# Patient Record
Sex: Female | Born: 1980 | Race: White | Hispanic: No | Marital: Married | Smoking: Former smoker
Health system: Southern US, Community
[De-identification: ages and names within clinical notes are randomized; demographics above are authoritative.]

## PROBLEM LIST (undated history)

## (undated) ENCOUNTER — Inpatient Hospital Stay (HOSPITAL_COMMUNITY): Payer: Self-pay

## (undated) DIAGNOSIS — R87629 Unspecified abnormal cytological findings in specimens from vagina: Secondary | ICD-10-CM

## (undated) DIAGNOSIS — E785 Hyperlipidemia, unspecified: Secondary | ICD-10-CM

## (undated) DIAGNOSIS — R519 Headache, unspecified: Secondary | ICD-10-CM

## (undated) DIAGNOSIS — B977 Papillomavirus as the cause of diseases classified elsewhere: Secondary | ICD-10-CM

## (undated) DIAGNOSIS — R51 Headache: Secondary | ICD-10-CM

## (undated) DIAGNOSIS — F32A Depression, unspecified: Secondary | ICD-10-CM

## (undated) DIAGNOSIS — F988 Other specified behavioral and emotional disorders with onset usually occurring in childhood and adolescence: Secondary | ICD-10-CM

## (undated) DIAGNOSIS — Z8619 Personal history of other infectious and parasitic diseases: Secondary | ICD-10-CM

## (undated) DIAGNOSIS — F329 Major depressive disorder, single episode, unspecified: Secondary | ICD-10-CM

## (undated) HISTORY — DX: Personal history of other infectious and parasitic diseases: Z86.19

## (undated) HISTORY — PX: COLPOSCOPY W/ BIOPSY / CURETTAGE: SUR283

## (undated) HISTORY — DX: Other specified behavioral and emotional disorders with onset usually occurring in childhood and adolescence: F98.8

## (undated) HISTORY — PX: WISDOM TOOTH EXTRACTION: SHX21

## (undated) HISTORY — DX: Hyperlipidemia, unspecified: E78.5

## (undated) HISTORY — DX: Unspecified abnormal cytological findings in specimens from vagina: R87.629

---
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
## 1999-06-09 ENCOUNTER — Other Ambulatory Visit: Admission: RE | Admit: 1999-06-09 | Discharge: 1999-06-09 | Payer: Self-pay | Admitting: Gynecology

## 2000-09-11 ENCOUNTER — Other Ambulatory Visit: Admission: RE | Admit: 2000-09-11 | Discharge: 2000-09-11 | Payer: Self-pay | Admitting: Gynecology

## 2003-08-13 ENCOUNTER — Other Ambulatory Visit: Admission: RE | Admit: 2003-08-13 | Discharge: 2003-08-13 | Payer: Self-pay | Admitting: Obstetrics and Gynecology

## 2005-12-28 ENCOUNTER — Ambulatory Visit: Payer: Self-pay | Admitting: Family Medicine

## 2007-02-12 ENCOUNTER — Ambulatory Visit: Payer: Self-pay | Admitting: Family Medicine

## 2007-05-14 ENCOUNTER — Ambulatory Visit: Payer: Self-pay | Admitting: Family Medicine

## 2015-01-12 LAB — OB RESULTS CONSOLE ANTIBODY SCREEN: Antibody Screen: NEGATIVE

## 2015-01-12 LAB — OB RESULTS CONSOLE RUBELLA ANTIBODY, IGM: RUBELLA: IMMUNE

## 2015-01-12 LAB — OB RESULTS CONSOLE HIV ANTIBODY (ROUTINE TESTING): HIV: NONREACTIVE

## 2015-01-12 LAB — OB RESULTS CONSOLE HEPATITIS B SURFACE ANTIGEN: Hepatitis B Surface Ag: NEGATIVE

## 2015-01-12 LAB — OB RESULTS CONSOLE ABO/RH: RH Type: POSITIVE

## 2015-01-12 LAB — OB RESULTS CONSOLE RPR: RPR: NONREACTIVE

## 2015-01-19 LAB — OB RESULTS CONSOLE GC/CHLAMYDIA
Chlamydia: NEGATIVE
Gonorrhea: NEGATIVE

## 2015-03-29 NOTE — L&D Delivery Note (Signed)
Delivery Note At 6:35 AM a viable and healthy female was delivered via Vaginal, Spontaneous Delivery (Presentation: ; Occiput Anterior).  APGAR:8 ,9 ; weight 6lb 12 oz .   Placenta status: spontaneous, intact not sent, .  Cord: CAN x 2 reducible with the following complications: Long.  Cord WJ:XBJYpH:none  Anesthesia: Local Epidural  Episiotomy: none  Lacerations: 2nd degree;Perineal Suture Repair: 3.0 chromic Est. Blood Loss (mL): 300  Mom to postpartum.  Baby to Couplet care / Skin to Skin.  Brytney Somes A 08/13/2015, 7:08 AM

## 2015-06-24 ENCOUNTER — Ambulatory Visit (INDEPENDENT_AMBULATORY_CARE_PROVIDER_SITE_OTHER): Payer: Self-pay | Admitting: Pediatrics

## 2015-06-24 DIAGNOSIS — Z7681 Expectant parent(s) prebirth pediatrician visit: Secondary | ICD-10-CM

## 2015-06-24 DIAGNOSIS — Z3490 Encounter for supervision of normal pregnancy, unspecified, unspecified trimester: Secondary | ICD-10-CM

## 2015-06-24 NOTE — Progress Notes (Signed)
Prenatal counseling for impending newborn done-- Z76.81  

## 2015-07-03 LAB — OB RESULTS CONSOLE GBS: STREP GROUP B AG: NEGATIVE

## 2015-08-12 ENCOUNTER — Inpatient Hospital Stay (HOSPITAL_COMMUNITY)
Admission: AD | Admit: 2015-08-12 | Discharge: 2015-08-15 | DRG: 774 | Disposition: A | Discharge status: Home / Self Care | Payer: Managed Care, Other (non HMO) | Source: Ambulatory Visit | Attending: Obstetrics and Gynecology | Admitting: Obstetrics and Gynecology

## 2015-08-12 ENCOUNTER — Inpatient Hospital Stay (HOSPITAL_COMMUNITY): Payer: Managed Care, Other (non HMO) | Admitting: Anesthesiology

## 2015-08-12 ENCOUNTER — Encounter (HOSPITAL_COMMUNITY): Payer: Self-pay | Admitting: *Deleted

## 2015-08-12 DIAGNOSIS — O4202 Full-term premature rupture of membranes, onset of labor within 24 hours of rupture: Principal | ICD-10-CM | POA: Diagnosis present

## 2015-08-12 DIAGNOSIS — Z87891 Personal history of nicotine dependence: Secondary | ICD-10-CM | POA: Diagnosis not present

## 2015-08-12 DIAGNOSIS — O324XX0 Maternal care for high head at term, not applicable or unspecified: Secondary | ICD-10-CM | POA: Diagnosis present

## 2015-08-12 DIAGNOSIS — Z3A38 38 weeks gestation of pregnancy: Secondary | ICD-10-CM

## 2015-08-12 DIAGNOSIS — A630 Anogenital (venereal) warts: Secondary | ICD-10-CM | POA: Diagnosis present

## 2015-08-12 DIAGNOSIS — O9832 Other infections with a predominantly sexual mode of transmission complicating childbirth: Secondary | ICD-10-CM | POA: Diagnosis present

## 2015-08-12 HISTORY — DX: Headache, unspecified: R51.9

## 2015-08-12 HISTORY — DX: Major depressive disorder, single episode, unspecified: F32.9

## 2015-08-12 HISTORY — DX: Headache: R51

## 2015-08-12 HISTORY — DX: Papillomavirus as the cause of diseases classified elsewhere: B97.7

## 2015-08-12 HISTORY — DX: Depression, unspecified: F32.A

## 2015-08-12 LAB — CBC
HCT: 35.6 % — ABNORMAL LOW (ref 36.0–46.0)
Hemoglobin: 11.7 g/dL — ABNORMAL LOW (ref 12.0–15.0)
MCH: 28.4 pg (ref 26.0–34.0)
MCHC: 32.9 g/dL (ref 30.0–36.0)
MCV: 86.4 fL (ref 78.0–100.0)
PLATELETS: 289 10*3/uL (ref 150–400)
RBC: 4.12 MIL/uL (ref 3.87–5.11)
RDW: 14.7 % (ref 11.5–15.5)
WBC: 8.8 10*3/uL (ref 4.0–10.5)

## 2015-08-12 LAB — TYPE AND SCREEN
ABO/RH(D): A POS
Antibody Screen: NEGATIVE

## 2015-08-12 LAB — ABO/RH: ABO/RH(D): A POS

## 2015-08-12 LAB — POCT FERN TEST: POCT Fern Test: POSITIVE

## 2015-08-12 MED ORDER — PHENYLEPHRINE 40 MCG/ML (10ML) SYRINGE FOR IV PUSH (FOR BLOOD PRESSURE SUPPORT)
80.0000 ug | PREFILLED_SYRINGE | INTRAVENOUS | Status: DC | PRN
  Filled 2015-08-12: qty 10

## 2015-08-12 MED ORDER — LIDOCAINE HCL (PF) 1 % IJ SOLN
INTRAMUSCULAR | Status: DC | PRN
  Administered 2015-08-12 (×2): 5 mL

## 2015-08-12 MED ORDER — OXYTOCIN 40 UNITS IN LACTATED RINGERS INFUSION - SIMPLE MED
1.0000 m[IU]/min | INTRAVENOUS | Status: DC
  Administered 2015-08-12: 2 m[IU]/min via INTRAVENOUS

## 2015-08-12 MED ORDER — TERBUTALINE SULFATE 1 MG/ML IJ SOLN
0.2500 mg | Freq: Once | INTRAMUSCULAR | Status: DC | PRN
Start: 2015-08-12 — End: 2015-08-13

## 2015-08-12 MED ORDER — OXYTOCIN 40 UNITS IN LACTATED RINGERS INFUSION - SIMPLE MED
2.5000 [IU]/h | INTRAVENOUS | Status: DC
  Filled 2015-08-12: qty 1000

## 2015-08-12 MED ORDER — ONDANSETRON HCL 4 MG/2ML IJ SOLN: 4.0000 mg | Freq: Four times a day (QID) | INTRAMUSCULAR | Status: DC | PRN

## 2015-08-12 MED ORDER — LIDOCAINE HCL (PF) 1 % IJ SOLN
30.0000 mL | INTRAMUSCULAR | Status: DC | PRN
  Administered 2015-08-13: 30 mL via SUBCUTANEOUS
  Filled 2015-08-12: qty 30

## 2015-08-12 MED ORDER — LACTATED RINGERS IV SOLN
500.0000 mL | Freq: Once | INTRAVENOUS | Status: AC
  Administered 2015-08-12: 500 mL via INTRAVENOUS

## 2015-08-12 MED ORDER — EPHEDRINE 5 MG/ML INJ: 10.0000 mg | INTRAVENOUS | Status: DC | PRN

## 2015-08-12 MED ORDER — FLEET ENEMA 7-19 GM/118ML RE ENEM: 1.0000 | ENEMA | RECTAL | Status: DC | PRN

## 2015-08-12 MED ORDER — OXYTOCIN BOLUS FROM INFUSION
500.0000 mL | INTRAVENOUS | Status: DC
Start: 2015-08-12 — End: 2015-08-13

## 2015-08-12 MED ORDER — PHENYLEPHRINE 40 MCG/ML (10ML) SYRINGE FOR IV PUSH (FOR BLOOD PRESSURE SUPPORT)
80.0000 ug | PREFILLED_SYRINGE | INTRAVENOUS | Status: DC | PRN
Start: 2015-08-12 — End: 2015-08-13

## 2015-08-12 MED ORDER — FENTANYL 2.5 MCG/ML BUPIVACAINE 1/10 % EPIDURAL INFUSION (WH - ANES)
14.0000 mL/h | INTRAMUSCULAR | Status: DC | PRN
  Administered 2015-08-12: 14 mL/h via EPIDURAL
  Filled 2015-08-12: qty 125

## 2015-08-12 MED ORDER — BUTORPHANOL TARTRATE 1 MG/ML IJ SOLN
2.0000 mg | INTRAMUSCULAR | Status: DC | PRN
  Administered 2015-08-12: 2 mg via INTRAVENOUS
  Filled 2015-08-12: qty 2

## 2015-08-12 MED ORDER — OXYCODONE-ACETAMINOPHEN 5-325 MG PO TABS: 2.0000 | ORAL_TABLET | ORAL | Status: DC | PRN

## 2015-08-12 MED ORDER — DIPHENHYDRAMINE HCL 50 MG/ML IJ SOLN: 12.5000 mg | INTRAMUSCULAR | Status: DC | PRN

## 2015-08-12 MED ORDER — CITRIC ACID-SODIUM CITRATE 334-500 MG/5ML PO SOLN: 30.0000 mL | ORAL | Status: DC | PRN

## 2015-08-12 MED ORDER — OXYCODONE-ACETAMINOPHEN 5-325 MG PO TABS: 1.0000 | ORAL_TABLET | ORAL | Status: DC | PRN

## 2015-08-12 MED ORDER — LACTATED RINGERS IV SOLN
INTRAVENOUS | Status: DC
  Administered 2015-08-12 – 2015-08-13 (×2): via INTRAVENOUS

## 2015-08-12 MED ORDER — ACETAMINOPHEN 325 MG PO TABS: 650.0000 mg | ORAL_TABLET | ORAL | Status: DC | PRN

## 2015-08-12 MED ORDER — LACTATED RINGERS IV SOLN: 500.0000 mL | INTRAVENOUS | Status: DC | PRN

## 2015-08-12 NOTE — Anesthesia Procedure Notes (Signed)
Epidural Patient location during procedure: OB  Staffing Anesthesiologist: Kellen Dutch Performed by: anesthesiologist   Preanesthetic Checklist Completed: patient identified, site marked, surgical consent, pre-op evaluation, timeout performed, IV checked, risks and benefits discussed and monitors and equipment checked  Epidural Patient position: sitting Prep: DuraPrep Patient monitoring: heart rate, continuous pulse ox and blood pressure Approach: right paramedian Location: L4-L5 Injection technique: LOR saline  Needle:  Needle type: Tuohy  Needle gauge: 17 G Needle length: 9 cm and 9 Needle insertion depth: 6 cm Catheter type: closed end flexible Catheter size: 20 Guage Catheter at skin depth: 10 cm Test dose: negative  Assessment Events: blood not aspirated, injection not painful, no injection resistance, negative IV test and no paresthesia  Additional Notes Patient identified. Risks/Benefits/Options discussed with patient including but not limited to bleeding, infection, nerve damage, paralysis, failed block, incomplete pain control, headache, blood pressure changes, nausea, vomiting, reactions to medication both or allergic, itching and postpartum back pain. Confirmed with bedside nurse the patient's most recent platelet count. Confirmed with patient that they are not currently taking any anticoagulation, have any bleeding history or any family history of bleeding disorders. Patient expressed understanding and wished to proceed. All questions were answered. Sterile technique was used throughout the entire procedure. Please see nursing notes for vital signs. Test dose was given through epidural needle and negative prior to continuing to dose epidural or start infusion. Warning signs of high block given to the patient including shortness of breath, tingling/numbness in hands, complete motor block, or any concerning symptoms with instructions to call for help. Patient was given  instructions on fall risk and not to get out of bed. All questions and concerns addressed with instructions to call with any issues.   

## 2015-08-12 NOTE — H&P (Signed)
Ann Morris is a 35 y.o. female presenting with SROM clear fluid @2 :30 pm. GBS cx neg  Maternal Medical History:  Reason for admission: Rupture of membranes.   Contractions: Frequency: rare.    Fetal activity: Perceived fetal activity is normal.    Prenatal complications: no prenatal complications   OB History    Gravida Para Term Preterm AB TAB SAB Ectopic Multiple Living   1              Past Medical History  Diagnosis Date  . Headache   . Depression   . HPV in female    Past Surgical History  Procedure Laterality Date  . Colposcopy w/ biopsy / curettage     Family History: family history is not on file. Social History:  reports that she quit smoking about 7 months ago. She does not have any smokeless tobacco history on file. She reports that she drinks alcohol. She reports that she does not use illicit drugs.   Prenatal Transfer Tool  Maternal Diabetes: No Genetic Screening: Normal Maternal Ultrasounds/Referrals: Normal Fetal Ultrasounds or other Referrals:  None Maternal Substance Abuse:  No Significant Maternal Medications:  Meds include: Zoloft Significant Maternal Lab Results:  Lab values include: Group B Strep negative Other Comments:  None  Review of Systems  All other systems reviewed and are negative.   Dilation: 3.5 Effacement (%): 80 Station: -2 Exam by:: kim fields, rn Blood pressure 117/68, pulse 69, temperature 97.6 F (36.4 C), temperature source Oral, resp. rate 20, height 5\' 1"  (1.549 m), weight 92.08 kg (203 lb). Exam Physical Exam  Constitutional: She is oriented to person, place, and time. She appears well-developed and well-nourished.  HENT:  Head: Atraumatic.  Eyes: EOM are normal.  Neck: Neck supple.  Cardiovascular: Regular rhythm.   Respiratory: Breath sounds normal.  GI: Soft.  Musculoskeletal: She exhibits edema.  Neurological: She is alert and oriented to person, place, and time.  Skin: Skin is warm and dry.   Psychiatric: She has a normal mood and affect.    Prenatal labs: ABO, Rh: --/--/A POS (05/17 1650) Antibody: NEG (05/17 1650) Rubella: Immune (10/17 0000) RPR: Nonreactive (10/17 0000)  HBsAg: Negative (10/17 0000)  HIV: Non-reactive (10/17 0000)  GBS: Negative (04/07 0000)   Assessment/Plan: SROM Term gestation P) admit routine labs. Pitocin augmentation.Epidural   Ann Morris A 08/12/2015, 6:31 PM

## 2015-08-12 NOTE — Anesthesia Preprocedure Evaluation (Signed)

## 2015-08-12 NOTE — MAU Note (Signed)
Urine sent to lab @ 1348

## 2015-08-13 ENCOUNTER — Encounter (HOSPITAL_COMMUNITY): Payer: Self-pay | Admitting: Obstetrics and Gynecology

## 2015-08-13 LAB — RPR: RPR: NONREACTIVE

## 2015-08-13 MED ORDER — WITCH HAZEL-GLYCERIN EX PADS: 1.0000 "application " | MEDICATED_PAD | CUTANEOUS | Status: DC | PRN

## 2015-08-13 MED ORDER — BENZOCAINE-MENTHOL 20-0.5 % EX AERO
1.0000 "application " | INHALATION_SPRAY | CUTANEOUS | Status: DC | PRN
  Filled 2015-08-13 (×3): qty 56

## 2015-08-13 MED ORDER — SENNOSIDES-DOCUSATE SODIUM 8.6-50 MG PO TABS
2.0000 | ORAL_TABLET | ORAL | Status: DC
  Administered 2015-08-13 – 2015-08-14 (×2): 2 via ORAL
  Filled 2015-08-13 (×2): qty 2

## 2015-08-13 MED ORDER — ACETAMINOPHEN 325 MG PO TABS
650.0000 mg | ORAL_TABLET | ORAL | Status: DC | PRN
  Administered 2015-08-14 – 2015-08-15 (×3): 650 mg via ORAL
  Filled 2015-08-13 (×3): qty 2

## 2015-08-13 MED ORDER — ONDANSETRON HCL 4 MG/2ML IJ SOLN: 4.0000 mg | INTRAMUSCULAR | Status: DC | PRN

## 2015-08-13 MED ORDER — ZOLPIDEM TARTRATE 5 MG PO TABS: 5.0000 mg | ORAL_TABLET | Freq: Every evening | ORAL | Status: DC | PRN

## 2015-08-13 MED ORDER — IBUPROFEN 600 MG PO TABS
600.0000 mg | ORAL_TABLET | Freq: Four times a day (QID) | ORAL | Status: DC
  Administered 2015-08-13 – 2015-08-14 (×4): 600 mg via ORAL
  Filled 2015-08-13 (×4): qty 1

## 2015-08-13 MED ORDER — DIBUCAINE 1 % RE OINT
1.0000 "application " | TOPICAL_OINTMENT | RECTAL | Status: DC | PRN
  Filled 2015-08-13: qty 28.4

## 2015-08-13 MED ORDER — OXYCODONE HCL 5 MG PO TABS
10.0000 mg | ORAL_TABLET | ORAL | Status: DC | PRN
Start: 2015-08-13 — End: 2015-08-14

## 2015-08-13 MED ORDER — SIMETHICONE 80 MG PO CHEW: 80.0000 mg | CHEWABLE_TABLET | ORAL | Status: DC | PRN

## 2015-08-13 MED ORDER — PRENATAL MULTIVITAMIN CH
1.0000 | ORAL_TABLET | Freq: Every day | ORAL | Status: DC
  Administered 2015-08-13 – 2015-08-14 (×2): 1 via ORAL
  Filled 2015-08-13 (×2): qty 1

## 2015-08-13 MED ORDER — COCONUT OIL OIL
1.0000 "application " | TOPICAL_OIL | Status: DC | PRN
  Filled 2015-08-13: qty 120

## 2015-08-13 MED ORDER — FERROUS SULFATE 325 (65 FE) MG PO TABS
325.0000 mg | ORAL_TABLET | Freq: Two times a day (BID) | ORAL | Status: DC
  Administered 2015-08-13 – 2015-08-14 (×3): 325 mg via ORAL
  Filled 2015-08-13 (×4): qty 1

## 2015-08-13 MED ORDER — ONDANSETRON HCL 4 MG PO TABS: 4.0000 mg | ORAL_TABLET | ORAL | Status: DC | PRN

## 2015-08-13 MED ORDER — DIPHENHYDRAMINE HCL 25 MG PO CAPS: 25.0000 mg | ORAL_CAPSULE | Freq: Four times a day (QID) | ORAL | Status: DC | PRN

## 2015-08-13 MED ORDER — PNEUMOCOCCAL VAC POLYVALENT 25 MCG/0.5ML IJ INJ
0.5000 mL | INJECTION | INTRAMUSCULAR | Status: DC
  Filled 2015-08-13: qty 0.5

## 2015-08-13 MED ORDER — OXYCODONE HCL 5 MG PO TABS: 5.0000 mg | ORAL_TABLET | ORAL | Status: DC | PRN

## 2015-08-13 NOTE — Lactation Note (Signed)
This note was copied from a baby's chart. Lactation Consultation Note  Patient Name: [name] JXBJY'NToday's Date: 08/13/2015 Reason for consult: Initial assessment  Visited with Mom, baby 5 hrs old.  Baby has breast fed 2 times.  Baby had baby in cradle hold skin to skin.  Baby opening his mouth, so offered an assist.  Assisted Mom to use a cross cradle hold.  Baby opens widely and takes a few sucks before falling asleep.  Showed Mom how to firmly support baby's head, and shape her breast using breast compression.  Baby opens very widely.  Recommended keeping baby on her chest skin to skin.  To feed often on cue.  Recommended using manual breast expression to express some colostrum prior to latching.  Mom explained importance of asking for help when feeding baby.  Brochure left in room.  Explained about IP and OP lactation support available to her.  To call for help prn.  Consult Status Consult Status: Follow-up Date: 08/14/15 Follow-up type: In-patient    [name], [name] E 08/13/2015, 12:17 PM

## 2015-08-13 NOTE — Progress Notes (Signed)
MOB was referred for history of depression/anxiety. * Referral screened out by Clinical Social Worker because none of the following criteria appear to apply: ~ History of anxiety/depression during this pregnancy, or of post-partum depression. ~ Diagnosis of anxiety and/or depression within last 3 years OR * MOB's symptoms currently being treated with medication and/or therapy. Please contact the Clinical Social Worker if needs arise, or if MOB requests.  MOB has rx for Zoloft. 

## 2015-08-13 NOTE — Anesthesia Postprocedure Evaluation (Signed)
Anesthesia Post Note  Patient: Ann Morris  Procedure(s) Performed: * No procedures listed *  Patient location during evaluation: Mother Baby Anesthesia Type: Epidural Level of consciousness: awake and alert and oriented Pain management: satisfactory to patient Vital Signs Assessment: post-procedure vital signs reviewed and stable Respiratory status: spontaneous breathing and nonlabored ventilation Cardiovascular status: stable Postop Assessment: no headache, no backache, no signs of nausea or vomiting, adequate PO intake and patient able to bend at knees (patient up walking) Anesthetic complications: no     Last Vitals:  Filed Vitals:   08/13/15 0845 08/13/15 0950  BP: 111/60 102/61  Pulse: 79 85  Temp: 36.8 C 36.8 C  Resp: 16 18    Last Pain:  Filed Vitals:   08/13/15 1204  PainSc: 2    Pain Goal: Patients Stated Pain Goal: 2 (08/13/15 1203)               Madison HickmanGREGORY,Marly Schuld

## 2015-08-13 NOTE — Progress Notes (Signed)
S; feels no ctx  O: Epidural VE fully/+1 station Asynclitic LOA  Tracing: baseline 135 (+) accel Ctx q 2-4 mins  IMP: arrest of descent P) right exaggerated sims. Guarded for vaginal delivery. Turn down epidural

## 2015-08-14 LAB — CBC
HEMATOCRIT: 33.2 % — AB (ref 36.0–46.0)
Hemoglobin: 10.7 g/dL — ABNORMAL LOW (ref 12.0–15.0)
MCH: 28.2 pg (ref 26.0–34.0)
MCHC: 32.2 g/dL (ref 30.0–36.0)
MCV: 87.4 fL (ref 78.0–100.0)
Platelets: 262 10*3/uL (ref 150–400)
RBC: 3.8 MIL/uL — ABNORMAL LOW (ref 3.87–5.11)
RDW: 15 % (ref 11.5–15.5)
WBC: 10.5 10*3/uL (ref 4.0–10.5)

## 2015-08-14 MED ORDER — IBUPROFEN 800 MG PO TABS
800.0000 mg | ORAL_TABLET | Freq: Three times a day (TID) | ORAL | Status: DC
Start: 2015-08-14 — End: 2015-08-15
  Administered 2015-08-14 – 2015-08-15 (×3): 800 mg via ORAL
  Filled 2015-08-14 (×3): qty 1

## 2015-08-14 MED ORDER — FERROUS SULFATE 325 (65 FE) MG PO TABS
325.0000 mg | ORAL_TABLET | Freq: Every day | ORAL | Status: DC
  Administered 2015-08-15: 325 mg via ORAL
  Filled 2015-08-14: qty 1

## 2015-08-14 MED ORDER — MAGNESIUM OXIDE 400 (241.3 MG) MG PO TABS
400.0000 mg | ORAL_TABLET | Freq: Every day | ORAL | Status: DC
  Administered 2015-08-14 – 2015-08-15 (×2): 400 mg via ORAL
  Filled 2015-08-14 (×2): qty 1

## 2015-08-14 NOTE — Lactation Note (Signed)
This note was copied from a baby's chart. Lactation Consultation Note  Patient Name: Ann Morris MaizeCourtney Brightbill RUEAV'WToday's Date: 08/14/2015 Reason for consult: Follow-up assessment (baby in the tx nursery for circ - see LC note )  [place] baby is having a circ presently. Per mom my nipples are tender especially the left .  LC assessed with moms permission and noted some bruising on the areolas and the nipples to  Be pinky red, areolas semi - compressible, (right areola more compressible than left ) , hand expressed,  small drops noted and LC had pump applied to nipples.  LC instructed mom on the basics - breast massage , hand express, pre- pump to make the areola more compressible  And reverse pressure. Shells between feedings ( LC instructed mom on the use of shells and reviewed hand pump)  After mom put on her breast feeding bra - LC assisted with shells.  Per mom will have a Spectra at home ( DEBP ).  LC encouraged mom to call when baby is showing feeding cues for feeding assessment.  LC explained to mom post circ baby's tend to be sleepy , skin to skin is important, hand expressing and call for assistance.  Steps above.    Maternal Data Has patient been taught Hand Expression?: Yes  Feeding    LATCH Score/Interventions                Intervention(s): Breastfeeding basics reviewed     Lactation Tools Discussed/Used Tools: Shells;Pump Shell Type: Inverted Breast pump type: Manual Pump Review: Setup, frequency, and cleaning Initiated by:: Reviewed set up of the hand pump  Date initiated:: 08/14/15   Consult Status Consult Status: Follow-up Date: 08/14/15 Follow-up type: In-patient    Kathrin Greathouseorio, Catrell Morrone Ann 08/14/2015, 4:14 PM

## 2015-08-14 NOTE — Lactation Note (Signed)
This note was copied from a baby's chart. Lactation Consultation Note  Patient Name: Ann Eustaquio MaizeCourtney Gray XBMWU'XToday's Date: 08/14/2015 Reason for consult: Follow-up assessment;Breast/nipple pain Mom had baby latched to left breast in cross cradle hold. Mom complains of continued nipple pain with nursing. Baby appears to have deep latch and demonstrating good suckling bursts. Nipple had slight crease when baby came off the breast. Baby starting to cluster feed. Assisted Mom with latching baby in asymmetrical latch to right breast. Mom reports mild discomfort. Care for sore nipples reviewed, use EBM, coconut oil. Gave comfort gels, Mom asking about putting ice on breast. Advised to alternate comfort gels with coconut oil. Advised to alternate positions each feeding to help nipples heal. Advised to call for assist with latch as needed. If nipple pain does not improve with improved latch, consider nipple shield. No nipple breakdown noted at this visit.   Maternal Data    Feeding Feeding Type: Breast Fed Length of feed: 20 min  LATCH Score/Interventions Latch: Grasps breast easily, tongue down, lips flanged, rhythmical sucking. Intervention(s): Skin to skin Intervention(s): Adjust position;Assist with latch;Breast massage;Breast compression  Audible Swallowing: A few with stimulation Intervention(s): Skin to skin  Type of Nipple: Everted at rest and after stimulation  Comfort (Breast/Nipple): Filling, red/small blisters or bruises, mild/mod discomfort  Problem noted: Mild/Moderate discomfort Interventions (Mild/moderate discomfort): Hand massage;Hand expression;Comfort gels (EBM to sore nipples w/comfort gels. Alternate w/coconut oil)  Hold (Positioning): Assistance needed to correctly position infant at breast and maintain latch.  LATCH Score: 7  Lactation Tools Discussed/Used     Consult Status Consult Status: Follow-up Date: 08/15/15 Follow-up type: In-patient    Alfred LevinsGranger, Illya Gienger  Ann 08/14/2015, 7:53 PM

## 2015-08-14 NOTE — Progress Notes (Signed)
PPD 2 SVD  S:  Reports feeling well - ready to go home             Tolerating po/ No nausea or vomiting             Bleeding is light             Pain controlled with motrin             Up ad lib / ambulatory / voiding QS  Newborn breast feeding   O:               VS: BP 114/66 mmHg  Pulse 82  Temp(Src) 98.2 F (36.8 C) (Oral)  Resp 18  Ht 5\' 1"  (1.549 m)  Wt 92.08 kg (203 lb)  BMI 38.38 kg/m2  SpO2 82%  Breastfeeding? Unknown   LABS:              Recent Labs  08/12/15 1650 08/14/15 0556  WBC 8.8 10.5  HGB 11.7* 10.7*  PLT 289 262               Blood type: --/--/A POS, A POS (05/17 1650)  Rubella: Immune (10/17 0000)                       Physical Exam:             Alert and oriented X3  Abdomen: soft, non-tender, non-distended              Fundus: firm, non-tender, U-1  Perineum: no edema  Lochia: light  Extremities: no edema, no calf pain or tenderness    A: PPD # 2   Doing well - stable status  P: Routine post partum orders  DC home - WOB booklet - instructions reviewed  Marlinda MikeBAILEY, TANYA CNM, MSN, [facility] 08/14/2015, 8:23 AM

## 2015-08-15 MED ORDER — IBUPROFEN 800 MG PO TABS
800.0000 mg | ORAL_TABLET | Freq: Three times a day (TID) | ORAL | Status: DC
Start: 2015-08-15 — End: 2016-02-01

## 2015-08-15 NOTE — Discharge Summary (Signed)
Obstetric Discharge Summary Reason for Admission: onset of labor Prenatal Procedures: none Intrapartum Procedures: spontaneous vaginal delivery and epidural Postpartum Procedures: none Complications-Operative and Postpartum: 2nd degree perineal laceration HEMOGLOBIN  Date Value Ref Range Status  08/14/2015 10.7* 12.0 - 15.0 g/dL Final   HCT  Date Value Ref Range Status  08/14/2015 33.2* 36.0 - 46.0 % Final    Physical Exam:  General: alert, cooperative and no distress Lochia: appropriate Uterine Fundus: firm Incision: healing well DVT Evaluation: No evidence of DVT seen on physical exam.  Discharge Diagnoses: Term Pregnancy-delivered  Discharge Information: Date: 08/15/2015 Activity: pelvic rest Diet: routine Medications: PNV and Ibuprofen Condition: stable Instructions: refer to practice specific booklet Discharge to: home Follow-up Information    Follow up with COUSINS,SHERONETTE A, MD. Schedule an appointment as soon as possible for a visit in 6 weeks.   Specialty:  Obstetrics and Gynecology   Contact information:   [address] [place] [id number] [phone]       Newborn Data: Live born female  Birth Weight: 6 lb 12.6 oz (3080 g) APGAR: 8, 9  Home with mother.  Marlinda MikeBAILEY, TANYA 08/15/2015, 11:01 AM

## 2015-08-15 NOTE — Lactation Note (Signed)
This note was copied from a baby's chart. Lactation Consultation Note  Patient Name: Boy Eustaquio MaizeCourtney Ostlund AVWUJ'WToday's Date: 08/15/2015 Reason for consult: Follow-up assessment Baby 52 hours old and has been to the breast consistently and per mom latching has improved since using the breast shells.  [facility] - in cross cradle - multiply swallows noted and increased with breast compressions.  Mom comfortable with latch. When baby released after 25 mins , nipple slightly slanted , areola more compressible than yesterday.  Nipples appear less sore and pink. Mom was given comfort gels last night. Has a hand pump. LC increased flanged to #27 for when The milk comes in . Sore nipple and engorgement prevention and tx reviewed. Referred to the Baby and me booklet pages 24 -25.  Per mom has Spectra DEBP at home.  Mother informed of post-discharge support and given phone number to the lactation department, including services for phone call assistance; out-patient appointments; and breastfeeding support group. List of other breastfeeding resources in the community given in the handout. Encouraged mother to call for problems or concerns related to breastfeeding.  Maternal Data    Feeding Feeding Type:  (baby already latched w/ multiply swallows, increased w/ breast compressions ) Length of feed: 25 min (muliply swallows noted )  LATCH Score/Interventions Latch:  (latched with depth ) Intervention(s): Skin to skin  Audible Swallowing:  (multiply swallows, increased with breast compressions )  Type of Nipple:  (when baby released nipple more erect than yesterday , and areola more compressed )  Comfort (Breast/Nipple):  (per mom latch more comfortable )     Hold (Positioning):  (mom independent with latch ) Intervention(s): Breastfeeding basics reviewed     Lactation Tools Discussed/Used Tools: Shells;Flanges Flange Size: 27 (X 2 ) Shell Type: Inverted Breast  pump type: Manual WIC Program: No   Consult Status Consult Status: Complete Date: 08/15/15    Kathrin Greathouseorio, Jw Covin Ann 08/15/2015, 10:44 AM

## 2015-08-15 NOTE — Progress Notes (Signed)
PPD 1 SVD  S:  Reports feeling well - tried             Tolerating po/ No nausea or vomiting             Bleeding is moderate             Pain controlled with motrin             Up ad lib / ambulatory / voiding QS  Newborn breast feeding   O:  VS: temp 98.2 - pulse 82 - resp 18 - BP 114/66  LABS:              Recent Labs  08/12/15 1650 08/14/15 0556  WBC 8.8 10.5  HGB 11.7* 10.7*  PLT 289 262               Blood type: --/--/A POS, A POS (05/17 1650)  Rubella: Immune (10/17 0000)                         Physical Exam:             Alert and oriented X3  Abdomen: soft, non-tender, non-distended              Fundus: firm, non-tender, Ueven  Perineum: ice pack in place  Lochia: light  Extremities: trace edema, no calf pain or tenderness    A: PPD # 1   Doing well - stable status  P: Routine post partum orders    Marlinda MikeBAILEY, Catarino Vold CNM, MSN, [facility]NM 08/14/2015, 10:58 AM

## 2016-01-29 ENCOUNTER — Encounter: Payer: Self-pay | Admitting: Medical

## 2016-02-01 ENCOUNTER — Ambulatory Visit (INDEPENDENT_AMBULATORY_CARE_PROVIDER_SITE_OTHER): Payer: 59 | Admitting: Family Medicine

## 2016-02-01 ENCOUNTER — Encounter: Payer: Self-pay | Admitting: Family Medicine

## 2016-02-01 VITALS — BP 120/64 | HR 68 | Ht 60.75 in | Wt 188.4 lb

## 2016-02-01 DIAGNOSIS — Z23 Encounter for immunization: Secondary | ICD-10-CM | POA: Diagnosis not present

## 2016-02-01 DIAGNOSIS — Z8349 Family history of other endocrine, nutritional and metabolic diseases: Secondary | ICD-10-CM

## 2016-02-01 DIAGNOSIS — Z0000 Encounter for general adult medical examination without abnormal findings: Secondary | ICD-10-CM | POA: Diagnosis not present

## 2016-02-01 LAB — POCT URINALYSIS DIPSTICK
Bilirubin, UA: NEGATIVE
Glucose, UA: NEGATIVE
Ketones, UA: NEGATIVE
Leukocytes, UA: NEGATIVE
NITRITE UA: NEGATIVE
PH UA: 6
Protein, UA: NEGATIVE
RBC UA: NEGATIVE
Spec Grav, UA: >=1.03
UROBILINOGEN UA: NEGATIVE

## 2016-02-01 LAB — COMPREHENSIVE METABOLIC PANEL
ALK PHOS: 95 U/L (ref 33–115)
ALT: 15 U/L (ref 6–29)
AST: 21 U/L (ref 10–30)
Albumin: 4.5 g/dL (ref 3.6–5.1)
BUN: 11 mg/dL (ref 7–25)
CO2: 25 mmol/L (ref 20–31)
Calcium: 9.2 mg/dL (ref 8.6–10.2)
Chloride: 103 mmol/L (ref 98–110)
Creat: 0.6 mg/dL (ref 0.50–1.10)
GLUCOSE: 87 mg/dL (ref 65–99)
POTASSIUM: 4.4 mmol/L (ref 3.5–5.3)
Sodium: 137 mmol/L (ref 135–146)
Total Bilirubin: 0.4 mg/dL (ref 0.2–1.2)
Total Protein: 7 g/dL (ref 6.1–8.1)

## 2016-02-01 LAB — CBC WITH DIFFERENTIAL/PLATELET
BASOS PCT: 0 %
Basophils Absolute: 0 cells/uL (ref 0–200)
Eosinophils Absolute: 136 cells/uL (ref 15–500)
Eosinophils Relative: 2 %
HCT: 41.6 % (ref 35.0–45.0)
Hemoglobin: 14.2 g/dL (ref 11.7–15.5)
LYMPHS PCT: 35 %
Lymphs Abs: 2380 cells/uL (ref 850–3900)
MCH: 29.9 pg (ref 27.0–33.0)
MCHC: 34.1 g/dL (ref 32.0–36.0)
MCV: 87.6 fL (ref 80.0–100.0)
MONOS PCT: 5 %
MPV: 10.4 fL (ref 7.5–12.5)
Monocytes Absolute: 340 cells/uL (ref 200–950)
Neutro Abs: 3944 cells/uL (ref 1500–7800)
Neutrophils Relative %: 58 %
PLATELETS: 312 10*3/uL (ref 140–400)
RBC: 4.75 MIL/uL (ref 3.80–5.10)
RDW: 12.8 % (ref 11.0–15.0)
WBC: 6.8 10*3/uL (ref 4.0–10.5)

## 2016-02-01 LAB — TSH: TSH: 0.54 mIU/L

## 2016-02-01 LAB — LIPID PANEL
CHOL/HDL RATIO: 4.5 ratio (ref <5.0)
Cholesterol: 191 mg/dL (ref <200)
HDL: 42 mg/dL — AB (ref >50)
LDL CALC: 123 mg/dL — AB
Triglycerides: 132 mg/dL (ref <150)
VLDL: 26 mg/dL (ref <30)

## 2016-02-01 NOTE — Progress Notes (Signed)
Subjective:    Patient ID: Ann Morris, female    DOB: 08/28/1980, 35 y.o.   MRN: [id number]  HPI Chief Complaint  Patient presents with  . new pt    new pt, cpe, wants flu shot today. no other concerns   She is new to the practice and here for a complete physical exam. She states she had a baby 5 1/2 months.  She is not breastfeeding.  Previous medical care: mainly at OB/GYN and urgent care as needed.  Last CPE: 3 years ago.   Other providers: Dr. Cherly Hensenousins- OB/GYN . Dr. Evelene CroonKaur- psychologist   Past medical history: depression- taking Zoloft since HS.  ADHD- takes adderall and this is prescribed by Dr. Evelene CroonKaur.  Surgeries: oral surgery.   Family history: HTN- mother. Hyperlipidemia- brother. DM in father and PGF. Heart attacks with PGF.    Social history: Lives with husband.  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 [employer] grad.  Former smoker and quit when she got pregnant.  Drinks alcohol 2 glasses 3 nights per week. denies drug use  Diet: nothing particular. She tries to eat healthy.  Excerise: is not currently.   Immunizations: Tdap up to date. Flu shot today.   Health maintenance:  Mammogram: never Colonoscopy: never Last Gynecological Exam: pap smear in October 2016. Last Menstrual cycle: currently.  Pregnancies: 1 Last Dental Exam: quarterly  Last Eye Exam: eye doctor and is up to date.   Wears seatbelt always, uses sunscreen, smoke detectors in home and functioning, does not text while driving and feels safe in home environment.   Reviewed allergies, medications, past medical, surgical, family, and social history.   Review of Systems Review of Systems Constitutional: -fever, -chills, -sweats, -unexpected weight change,-fatigue ENT: -runny nose, -ear pain, -sore throat Cardiology:  -chest pain, -palpitations, -edema Respiratory: -cough, -shortness of breath, -wheezing Gastroenterology: -abdominal pain, -nausea, -vomiting, -diarrhea, -constipation    Hematology: -bleeding or bruising problems Musculoskeletal: -arthralgias, -myalgias, -joint swelling, -back pain Ophthalmology: -vision changes Urology: -dysuria, -difficulty urinating, -hematuria, -urinary frequency, -urgency Neurology: -headache, -weakness, -tingling, -numbness       Objective:   Physical Exam BP 120/64   Pulse 68   Ht 5' 0.75" (1.543 m)   Wt 188 lb 6.4 oz (85.5 kg)   Breastfeeding? No   BMI 35.89 kg/m   General Appearance:    Alert, cooperative, no distress, appears stated age  Head:    Normocephalic, without obvious abnormality, atraumatic  Eyes:    PERRL, conjunctiva/corneas clear, EOM's intact, fundi    benign  Ears:    Normal TM's and external ear canals  Nose:   Nares normal, mucosa normal, no drainage or sinus   tenderness  Throat:   Lips, mucosa, and tongue normal; teeth and gums normal  Neck:   Supple, no lymphadenopathy;  thyroid:  no   enlargement/tenderness/nodules; no carotid   bruit or JVD  Back:    Spine nontender, no curvature, ROM normal, no CVA     tenderness  Lungs:     Clear to auscultation bilaterally without wheezes, rales or     ronchi; respirations unlabored  Chest Wall:    No tenderness or deformity   Heart:    Regular rate and rhythm, S1 and S2 normal, no murmur, rub   or gallop  Breast Exam:    Done at OB/GYN  Abdomen:     Soft, non-tender, nondistended, normoactive bowel sounds,    no masses, no hepatosplenomegaly  Genitalia:  Done at OB/GYN  Rectal:    Not performed due to age<40 and no related complaints  Extremities:   No clubbing, cyanosis or edema  Pulses:   2+ and symmetric all extremities  Skin:   Skin color, texture, turgor normal, no rashes or lesions  Lymph nodes:   Cervical, supraclavicular, and axillary nodes normal  Neurologic:   CNII-XII intact, normal strength, sensation and gait; reflexes 2+ and symmetric throughout          Psych:   Normal mood, affect, hygiene and grooming.    Urinalysis dipstick:  negative       Assessment & Plan:  Routine general medical examination at a health care facility - Plan: Urinalysis Dipstick, CBC with Differential/Platelet, Comprehensive metabolic panel, TSH, Lipid panel  Needs flu shot - Plan: Flu Vaccine QUAD 36+ mos IM  Family history of thyroid disease in sister - Plan: TSH  Discussed that overall she appears to be healthy. She has positive and has a good outlook on life. He appears to have a good home life and is doing well with her new baby. Discussed health promotion and safety. She has no concerns today. Discussed eating a healthy well balanced diet and getting at least 150 minutes of physical activity per week. Flu shot given today. We'll follow-up pending labs.

## 2016-02-01 NOTE — Patient Instructions (Signed)

## 2016-05-08 ENCOUNTER — Telehealth: Payer: Managed Care, Other (non HMO) | Admitting: Family

## 2016-05-08 DIAGNOSIS — H109 Unspecified conjunctivitis: Secondary | ICD-10-CM

## 2016-05-08 MED ORDER — POLYMYXIN B-TRIMETHOPRIM 10000-0.1 UNIT/ML-% OP SOLN: 1.0000 [drp] | OPHTHALMIC | 0 refills | Status: DC

## 2016-05-08 NOTE — Progress Notes (Signed)
We are sorry that you are not feeling well.  Here is how we plan to help!  Based on what you have shared with me it looks like you have conjunctivitis.  Conjunctivitis is a common inflammatory or infectious condition of the eye that is often referred to as "pink eye".  In most cases it is contagious (viral or bacterial). However, not all conjunctivitis requires antibiotics (ex. Allergic).  We have made appropriate suggestions for you based upon your presentation.  I have prescribed Polytrim Ophthalmic drops 1 drop every 4hrs in both eyes for 5 days  Pink eye can be highly contagious.  It is typically spread through direct contact with secretions, or contaminated objects or surfaces that one may have touched.  Strict handwashing is suggested with soap and water is urged.  If not available, use alcohol based had sanitizer.  Avoid unnecessary touching of the eye.  If you wear contact lenses, you will need to refrain from wearing them until you see no white discharge from the eye for at least 24 hours after being on medication.  You should see symptom improvement in 1-2 days after starting the medication regimen.  Call us if symptoms are not improved in 1-2 days.  Home Care:  Wash your hands often!  Do not wear your contacts until you complete your treatment plan.  Avoid sharing towels, bed linen, personal items with a person who has pink eye.  See attention for anyone in your home with similar symptoms.  Get Help Right Away If:  Your symptoms do not improve.  You develop blurred or loss of vision.  Your symptoms worsen (increased discharge, pain or redness)  Your e-visit answers were reviewed by a board certified advanced clinical practitioner to complete your personal care plan.  Depending on the condition, your plan could have included both over the counter or prescription medications.  If there is a problem please reply  once you have received a response from your provider.  Your safety  is important to us.  If you have drug allergies check your prescription carefully.    You can use MyChart to ask questions about today's visit, request a non-urgent call back, or ask for a work or school excuse for 24 hours related to this e-Visit. If it has been greater than 24 hours you will need to follow up with your provider, or enter a new e-Visit to address those concerns.   You will get an e-mail in the next two days asking about your experience.  I hope that your e-visit has been valuable and will speed your recovery. Thank you for using e-visits.

## 2016-05-09 ENCOUNTER — Telehealth: Payer: Managed Care, Other (non HMO) | Admitting: Family

## 2016-05-09 DIAGNOSIS — J329 Chronic sinusitis, unspecified: Secondary | ICD-10-CM

## 2016-05-09 DIAGNOSIS — B9689 Other specified bacterial agents as the cause of diseases classified elsewhere: Secondary | ICD-10-CM

## 2016-05-09 MED ORDER — AMOXICILLIN-POT CLAVULANATE 875-125 MG PO TABS: 1.0000 | ORAL_TABLET | Freq: Two times a day (BID) | ORAL | 0 refills | Status: AC

## 2016-05-09 NOTE — Progress Notes (Signed)

## 2016-06-13 ENCOUNTER — Ambulatory Visit (INDEPENDENT_AMBULATORY_CARE_PROVIDER_SITE_OTHER): Payer: 59 | Admitting: Medical

## 2016-06-13 ENCOUNTER — Encounter: Payer: Self-pay | Admitting: Medical

## 2016-06-13 VITALS — BP 110/68 | HR 74 | Temp 98.3°F | Wt 186.6 lb

## 2016-06-13 DIAGNOSIS — J320 Chronic maxillary sinusitis: Secondary | ICD-10-CM | POA: Diagnosis not present

## 2016-06-13 DIAGNOSIS — R059 Cough, unspecified: Secondary | ICD-10-CM

## 2016-06-13 DIAGNOSIS — R05 Cough: Secondary | ICD-10-CM | POA: Diagnosis not present

## 2016-06-13 MED ORDER — CLARITHROMYCIN 500 MG PO TABS: 500.0000 mg | ORAL_TABLET | Freq: Two times a day (BID) | ORAL | 0 refills | Status: DC

## 2016-06-13 MED ORDER — BENZONATATE 200 MG PO CAPS: 200.0000 mg | ORAL_CAPSULE | Freq: Three times a day (TID) | ORAL | 0 refills | Status: DC | PRN

## 2016-06-13 NOTE — Progress Notes (Signed)
Subjective: Chief Complaint  Patient presents with  . coughing, congestion    coughing, congestion , runny nose   Here for cough, congestion.  Back in February did e-visit, was diagnosed with sinusitis, started Augmentin.   Augmentin upset her stomach though so stopped this.  symptom have lingered. Didn't take all of the Augmentin, maybe took 3 days worth.   She notes cough, head congestion, stuffy, post nasal drip, has had runny nose.  No chest congestion.   Having to clear throat often.   No itchy eyes, runny eyes, sneezing.  Has sinus pressure.  Some upper teeth but not sure if related as it sometimes hurts to chew.    Has some sore throat, right.   No ear pain.   Taking mucinex DM.  No NVD.  She does smoke occasionally.  No other aggravating or relieving factors. No other complaint.   Past Medical History:  Diagnosis Date  . Depression   . Headache   . HPV in female   . Postpartum care following vaginal delivery (5/18) 08/13/2015   Current Outpatient Prescriptions on File Prior to Visit  Medication Sig Dispense Refill  . amphetamine-dextroamphetamine (ADDERALL) 30 MG tablet Take 30 mg by mouth daily.    . sertraline (ZOLOFT) 50 MG tablet Take 50 mg by mouth daily.     No current facility-administered medications on file prior to visit.    ROS as in subjective   Objective: BP 110/68   Pulse 74   Temp 98.3 F (36.8 C)   Wt 186 lb 9.6 oz (84.6 kg)   SpO2 96%   BMI 35.55 kg/m   General appearance: Alert, WD/WN, no distress                             Skin: warm, no rash                           Head: + mild maxillary sinus tenderness,                            Eyes: conjunctiva normal, corneas clear, PERRLA                            Ears: pearly TMs, external ear canals normal                          Nose: septum midline, turbinates swollen, with erythema and clear discharge             Mouth/throat: MMM, tongue normal, mild pharyngeal erythema  Neck: supple, no adenopathy, no thyromegaly, non tender                          Heart: RRR, normal S1, S2, no murmurs                         Lungs: CTA bilaterally, no wheezes, rales, or rhonchi       Assessment: Encounter Diagnoses  Name Primary?  . Chronic maxillary sinusitis Yes  . Cough     Plan: Advised rest, hydration, consider nasal saline, can try benadryl QHS instead of mucinex to see if she gets a better response to this.  Begin medications below.  If  not improving or worse within a week, then recheck  Toni AmendCourtney was seen today for coughing, congestion.  Diagnoses and all orders for this visit:  Chronic maxillary sinusitis  Cough  Other orders -     benzonatate (TESSALON) 200 MG capsule; Take 1 capsule (200 mg total) by mouth 3 (three) times daily as needed for cough. -     clarithromycin (BIAXIN) 500 MG tablet; Take 1 tablet (500 mg total) by mouth 2 (two) times daily.

## 2016-09-27 LAB — OB RESULTS CONSOLE HIV ANTIBODY (ROUTINE TESTING): HIV: NONREACTIVE

## 2016-09-27 LAB — OB RESULTS CONSOLE ABO/RH: RH Type: POSITIVE

## 2016-09-27 LAB — OB RESULTS CONSOLE RPR: RPR: NONREACTIVE

## 2016-09-27 LAB — OB RESULTS CONSOLE HEPATITIS B SURFACE ANTIGEN: Hepatitis B Surface Ag: NEGATIVE

## 2016-09-27 LAB — OB RESULTS CONSOLE ANTIBODY SCREEN: ANTIBODY SCREEN: NEGATIVE

## 2016-09-27 LAB — OB RESULTS CONSOLE RUBELLA ANTIBODY, IGM: Rubella: IMMUNE

## 2016-11-19 ENCOUNTER — Encounter (HOSPITAL_COMMUNITY): Payer: Self-pay | Admitting: *Deleted

## 2016-11-19 ENCOUNTER — Inpatient Hospital Stay (HOSPITAL_COMMUNITY): Payer: 59

## 2016-11-19 ENCOUNTER — Inpatient Hospital Stay (HOSPITAL_COMMUNITY)
Admission: AD | Admit: 2016-11-19 | Discharge: 2016-11-19 | Disposition: A | Discharge status: Home / Self Care | Payer: 59 | Source: Ambulatory Visit | Attending: Obstetrics & Gynecology | Admitting: Obstetrics & Gynecology

## 2016-11-19 DIAGNOSIS — O4692 Antepartum hemorrhage, unspecified, second trimester: Secondary | ICD-10-CM

## 2016-11-19 DIAGNOSIS — Z87891 Personal history of nicotine dependence: Secondary | ICD-10-CM | POA: Diagnosis not present

## 2016-11-19 DIAGNOSIS — O09522 Supervision of elderly multigravida, second trimester: Secondary | ICD-10-CM | POA: Diagnosis not present

## 2016-11-19 DIAGNOSIS — Z825 Family history of asthma and other chronic lower respiratory diseases: Secondary | ICD-10-CM | POA: Diagnosis not present

## 2016-11-19 DIAGNOSIS — O209 Hemorrhage in early pregnancy, unspecified: Secondary | ICD-10-CM | POA: Diagnosis not present

## 2016-11-19 DIAGNOSIS — Z3A16 16 weeks gestation of pregnancy: Secondary | ICD-10-CM

## 2016-11-19 DIAGNOSIS — Z8349 Family history of other endocrine, nutritional and metabolic diseases: Secondary | ICD-10-CM | POA: Insufficient documentation

## 2016-11-19 DIAGNOSIS — Z8249 Family history of ischemic heart disease and other diseases of the circulatory system: Secondary | ICD-10-CM | POA: Insufficient documentation

## 2016-11-19 DIAGNOSIS — N939 Abnormal uterine and vaginal bleeding, unspecified: Secondary | ICD-10-CM | POA: Diagnosis present

## 2016-11-19 LAB — URINALYSIS, ROUTINE W REFLEX MICROSCOPIC
Bilirubin Urine: NEGATIVE
Glucose, UA: NEGATIVE mg/dL
Ketones, ur: NEGATIVE mg/dL
Nitrite: NEGATIVE
Protein, ur: NEGATIVE mg/dL
Specific Gravity, Urine: 1.01 (ref 1.005–1.030)
pH: 6 (ref 5.0–8.0)

## 2016-11-19 LAB — URINALYSIS, MICROSCOPIC (REFLEX)

## 2016-11-19 NOTE — MAU Note (Signed)
Patient states she woke up this morning and noticed "a lot of blood in my underwear...like a period."  Pt states she is still bleeding, but "not as much."  No complaints of pain.  States she passed a large clot in the toilet here in MAU.

## 2016-11-19 NOTE — MAU Provider Note (Signed)
History   G1P1001 @ 16.2 wks in with painless vaginal bleeding that startedupon awakening this morning. States when arising panties were saturated and now upon admission to unit passed sm clot in BR.   CSN: [id number]  Arrival date & time 11/19/16  8299   None     Chief Complaint  Patient presents with  . Vaginal Bleeding    HPI  Past Medical History:  Diagnosis Date  . Depression   . Headache   . HPV in female   . Postpartum care following vaginal delivery (5/18) 08/13/2015    Past Surgical History:  Procedure Laterality Date  . COLPOSCOPY W/ BIOPSY / CURETTAGE      Family History  Problem Relation Age of Onset  . Hypertension Mother   . Asthma Father   . Thyroid disease Sister   . Hyperlipidemia Brother   . Colon cancer Maternal Grandmother        dx in 61s  . Heart attack Paternal Grandfather   . Diabetes Paternal Grandfather     Social History  Substance Use Topics  . Smoking status: Former Smoker    Quit date: 12/13/2014  . Smokeless tobacco: Never Used  . Alcohol use No     Comment: occ    OB History    Gravida Para Term Preterm AB Living   2 1 1     1    SAB TAB Ectopic Multiple Live Births         0 1      Review of Systems  Constitutional: Negative.   HENT: Negative.   Eyes: Negative.   Respiratory: Negative.   Cardiovascular: Negative.   Gastrointestinal: Negative.   Endocrine: Negative.   Genitourinary: Positive for vaginal bleeding.  Musculoskeletal: Negative.   Skin: Negative.   Allergic/Immunologic: Negative.   Neurological: Negative.   Hematological: Negative.   Psychiatric/Behavioral: Negative.     Allergies  Patient has no known allergies.  Home Medications    BP 135/81 (BP Location: Right Arm)   Pulse 77   Temp 98.1 F (36.7 C) (Oral)   Resp 18   Ht 5\' 1"  (1.549 m)   Wt 186 lb 4 oz (84.5 kg)   LMP 07/29/2016   SpO2 96%   BMI 35.19 kg/m   Physical Exam  Constitutional: She is oriented to person, place, and  time. She appears well-developed and well-nourished.  HENT:  Head: Normocephalic.  Neck: Normal range of motion.  Cardiovascular: Normal rate, regular rhythm, normal heart sounds and intact distal pulses.   Pulmonary/Chest: Effort normal and breath sounds normal.  Abdominal: Soft. Bowel sounds are normal.  Genitourinary: Vagina normal and uterus normal.  Musculoskeletal: Normal range of motion.  Neurological: She is alert and oriented to person, place, and time. She has normal reflexes.  Skin: Skin is warm and dry.  Psychiatric: She has a normal mood and affect. Her behavior is normal. Judgment and thought content normal.    MAU Course  Procedures (including critical care time)  Labs Reviewed  URINALYSIS, ROUTINE W REFLEX MICROSCOPIC   No results found.   1. Vaginal bleeding in pregnancy, second trimester   2. [redacted] weeks gestation of pregnancy       MDM  Pt having sm amt dark vag bleeding. FHR 152 st and reg per doppler. Vag exam cervix firm/cl/post. VSS. Blood type A pos. Will get ob US to evaluate bleeding. 10:48 [place] to sm amt dark bleeding at present. POC discussed with Dr. Juliene Pina pt to  f/u with Dr. Juliene Pina on Tuesday. OK to d/c home

## 2016-11-19 NOTE — Discharge Instructions (Signed)
Placenta Previa °Placenta previa is a condition in which the placenta implants in the lower part of the uterus in pregnant women. The placenta either partially or completely covers the opening to the cervix. This is a problem because the baby must pass through the cervix during delivery. There are three types of placenta previa: °· Marginal placenta previa. The placenta reaches within an inch (2.5 cm) of the cervical opening but does not cover it. °· Partial placenta previa. The placenta covers part of the cervical opening. °· Complete placenta previa. The placenta covers the entire cervical opening. ° °If the previa is marginal or partial and it is diagnosed in the first half of pregnancy, the placenta may move into a normal position as the pregnancy progresses and may no longer cover the cervix. It is important to keep all prenatal visits with your health care provider so you can be more closely monitored. °What are the causes? °The cause of this condition is not known. °What increases the risk? °This condition is more likely to develop in women who: °· Are carrying more than one baby (multiples). °· Have an abnormally shaped uterus. °· Have scars on the lining of the uterus. °· Have had surgeries involving the uterus, such as a cesarean delivery. °· Have delivered a baby before. °· Have a history of placenta previa. °· Have smoked or used cocaine during pregnancy. °· Are age 35 or older during pregnancy. ° °What are the signs or symptoms? °The main symptom of this condition is sudden, painless vaginal bleeding during the second half of pregnancy. The amount of bleeding can be very light at first, and it usually stops on its own. Heavier bleeding episodes may also happen. Some women with placenta previa may have no bleeding at all. °How is this diagnosed? °· This condition is diagnosed: °? From an ultrasound. This test uses sound waves to find where the placenta is located before you have any bleeding  episodes. °? During a checkup after vaginal bleeding is noticed. °· If you are diagnosed with a partial or complete previa, digital exams with fingers will generally be avoided. Your health care provider will still perform a speculum exam. °· If you did not have an ultrasound during your pregnancy, placenta previa may not be diagnosed until bleeding occurs during labor. °How is this treated? °Treatment for this condition may include: °· Decreased activity. °· Bed rest at home or in the hospital. °· Pelvic rest. Nothing is placed inside the vagina during pelvic rest. This means not having sex and not using tampons or douches. °· A blood transfusion to replace blood that you have lost (maternal blood loss). °· A cesarean delivery. This may be performed if: °? The bleeding is heavy and cannot be controlled. °? The placenta completely covers the cervix. °· Medicines to stop premature labor or to help the baby's lungs to mature. This treatment may be used if you need delivery before your pregnancy is full-term. ° °Your treatment will be decided based on: °· How much you are bleeding, or whether the bleeding has stopped. °· How far along you are in your pregnancy. °· The condition of your baby. °· The type of placenta previa that you have. ° °Follow these instructions at home: °· Get plenty of rest and lessen activity as told by your health care provider. °· Stay on bed rest for as long as told by your health care provider. °· Do not have sex, use tampons, use a douche, or place   anything inside of your vagina if your health care provider recommended pelvic rest. °· Take over-the-counter and prescription medicines as told by your health care provider. °· Keep all follow-up visits as told by your health care provider. This is important. °Get help right away if: °· You have vaginal bleeding, even if in small amounts and even if you have no pain. °· You have cramping or regular contractions. °· You have pain in your abdomen  or your lower back. °· You have a feeling of increased pressure in your pelvis. °· You have increased watery or bloody mucus from the vagina. °This information is not intended to replace advice given to you by your health care provider. Make sure you discuss any questions you have with your health care provider. °Document Released: 03/14/2005 Document Revised: 12/02/2015 Document Reviewed: 09/26/2015 °Elsevier Interactive Patient Education © 2018 [employer] ° °

## 2016-12-07 ENCOUNTER — Other Ambulatory Visit: Payer: Self-pay | Admitting: Obstetrics and Gynecology

## 2016-12-28 DIAGNOSIS — Z23 Encounter for immunization: Secondary | ICD-10-CM | POA: Diagnosis not present

## 2017-02-01 ENCOUNTER — Encounter: Payer: 59 | Admitting: Family Medicine

## 2017-02-05 NOTE — Progress Notes (Signed)
Subjective:    Patient ID: Ann Morris, female    DOB: 11/20/1980, 36 y.o.   MRN: [id number]  HPI Chief Complaint  Patient presents with  . cpe    fasting cpe,    She is here for a complete physical exam.  She is [redacted] weeks pregnant and she has regular prenatal care.  She and her husband and daughter lost their dog yesterday so she is sad about this. They may adopt a new pet soon.   Other providers: Dr. Cherly Hensenousins- OB/GYN Dr. Evelene CroonKaur- psychiatrist.  States she is having issues getting in to see Dr. Evelene CroonKaur and with the expense of the visit. Asks if I will refill her Zoloft when due. She has been stable on this medication for years.  She is getting her ADHD medication from Dr. Evelene CroonKaur also.   Social history: Lives with husband and 5417 month old, works for [employer].  Denies smoking, drinking alcohol, drug use  Diet: fairly healthy - unhealthy cravings during pregnancy Exercise: not currently  Immunizations: up to date   Health maintenance:  Mammogram: never  Colonoscopy: never  Last Gynecological Exam:  Pregnancies: 2 Last Dental Exam: every 4 months Last Eye Exam: last year   [facility] 2[facility]9 02/06/2017 02/01/2016  Decreased Interest 0 0  Down, Depressed, Hopeless 0 0  PHQ - 2 Score 0 0     Wears seatbelt always, uses sunscreen, smoke detectors in home and functioning, does not text while driving and feels safe in home environment.   Reviewed allergies, medications, past medical, surgical, family, and social history.   Review of Systems Review of Systems Constitutional: -fever, -chills, -sweats, -unexpected weight change,-fatigue ENT: -runny nose, -ear pain, -sore throat Cardiology:  -chest pain, -palpitations, -edema Respiratory: -cough, -shortness of breath, -wheezing Gastroenterology: -abdominal pain, -nausea, -vomiting, -diarrhea, -constipation  Hematology: -bleeding or bruising problems Musculoskeletal: -arthralgias, -myalgias, -joint swelling, -back  pain Ophthalmology: -vision changes Urology: -dysuria, -difficulty urinating, -hematuria, -urinary frequency, -urgency Neurology: -headache, -weakness, -tingling, -numbness       Objective:   Physical Exam BP 104/64   Pulse 72   Ht 5' 0.75" (1.543 m)   Wt 193 lb 6.4 oz (87.7 kg)   LMP 07/29/2016   BMI 36.84 kg/m   General Appearance:    Alert, cooperative, no distress, appears stated age  Head:    Normocephalic, without obvious abnormality, atraumatic  Eyes:    PERRL, conjunctiva/corneas clear, EOM's intact, fundi    benign  Ears:    Normal TM's and external ear canals  Nose:   Nares normal, mucosa normal, no drainage or sinus   tenderness  Throat:   Lips, mucosa, and tongue normal; teeth and gums normal  Neck:   Supple, no lymphadenopathy;  thyroid:  no   enlargement/tenderness/nodules; no carotid   bruit or JVD  Back:    Spine nontender, no curvature, ROM normal, no CVA     tenderness  Lungs:     Clear to auscultation bilaterally without wheezes, rales or     ronchi; respirations unlabored  Chest Wall:    No tenderness or deformity   Heart:    Regular rate and rhythm, S1 and S2 normal, no murmur, rub   or gallop  Breast Exam:    Done at OB/GYN  Abdomen:     Soft, non-tender, nondistended, normoactive bowel sounds,    no masses, no hepatosplenomegaly  Genitalia:    Done at OB/GYN  Rectal:    Not performed due to  age<40 and no related complaints  Extremities:   No clubbing, cyanosis or edema  Pulses:   2+ and symmetric all extremities  Skin:   Skin color, texture, turgor normal, no rashes or lesions  Lymph nodes:   Cervical, supraclavicular, and axillary nodes normal  Neurologic:   CNII-XII intact, normal strength, sensation and gait; reflexes 2+ and symmetric throughout          Psych:   Normal mood, affect, hygiene and grooming.    Urinalysis dipstick: negative       Assessment & Plan:  Routine general medical examination at a health care facility - Plan: POCT  Urinalysis DIP (Proadvantage Device), CBC with Differential/Platelet, Comprehensive metabolic panel, TSH, Lipid panel  Mixed hyperlipidemia - Plan: Lipid panel  [redacted] weeks gestation of pregnancy  She appears to be doing well and is happy about her pregnancy. She is being closely followed by her OB/GYN.  Her mood is stable.  Immunizations are up to date.  Discussed safety and health promotion.  Follow up pending labs.

## 2017-02-06 ENCOUNTER — Encounter: Payer: Self-pay | Admitting: Family Medicine

## 2017-02-06 ENCOUNTER — Ambulatory Visit (INDEPENDENT_AMBULATORY_CARE_PROVIDER_SITE_OTHER): Payer: 59 | Admitting: Family Medicine

## 2017-02-06 VITALS — BP 104/64 | HR 72 | Ht 60.75 in | Wt 193.4 lb

## 2017-02-06 DIAGNOSIS — Z3A27 27 weeks gestation of pregnancy: Secondary | ICD-10-CM

## 2017-02-06 DIAGNOSIS — F329 Major depressive disorder, single episode, unspecified: Secondary | ICD-10-CM | POA: Insufficient documentation

## 2017-02-06 DIAGNOSIS — Z0000 Encounter for general adult medical examination without abnormal findings: Secondary | ICD-10-CM

## 2017-02-06 DIAGNOSIS — E785 Hyperlipidemia, unspecified: Secondary | ICD-10-CM | POA: Insufficient documentation

## 2017-02-06 DIAGNOSIS — F32A Depression, unspecified: Secondary | ICD-10-CM | POA: Insufficient documentation

## 2017-02-06 DIAGNOSIS — E782 Mixed hyperlipidemia: Secondary | ICD-10-CM | POA: Diagnosis not present

## 2017-02-06 LAB — POCT URINALYSIS DIP (PROADVANTAGE DEVICE)
BILIRUBIN UA: NEGATIVE
Glucose, UA: NEGATIVE mg/dL
Ketones, POC UA: NEGATIVE mg/dL
LEUKOCYTES UA: NEGATIVE
NITRITE UA: NEGATIVE
PH UA: 7.5 (ref 5.0–8.0)
PROTEIN UA: NEGATIVE mg/dL
RBC UA: NEGATIVE
Specific Gravity, Urine: 1.015
UUROB: NEGATIVE

## 2017-02-06 NOTE — Patient Instructions (Signed)

## 2017-02-07 LAB — CBC WITH DIFFERENTIAL/PLATELET
BASOS PCT: 0.1 %
Basophils Absolute: 9 cells/uL (ref 0–200)
EOS PCT: 0.8 %
Eosinophils Absolute: 68 cells/uL (ref 15–500)
HCT: 32.4 % — ABNORMAL LOW (ref 35.0–45.0)
HEMOGLOBIN: 11.1 g/dL — AB (ref 11.7–15.5)
Lymphs Abs: 1632 cells/uL (ref 850–3900)
MCH: 29.6 pg (ref 27.0–33.0)
MCHC: 34.3 g/dL (ref 32.0–36.0)
MCV: 86.4 fL (ref 80.0–100.0)
MONOS PCT: 4.1 %
MPV: 11 fL (ref 7.5–12.5)
NEUTROS ABS: 6443 {cells}/uL (ref 1500–7800)
Neutrophils Relative %: 75.8 %
Platelets: 237 10*3/uL (ref 140–400)
RBC: 3.75 10*6/uL — AB (ref 3.80–5.10)
RDW: 13 % (ref 11.0–15.0)
TOTAL LYMPHOCYTE: 19.2 %
WBC mixed population: 349 cells/uL (ref 200–950)
WBC: 8.5 10*3/uL (ref 3.8–10.8)

## 2017-02-07 LAB — LIPID PANEL
CHOL/HDL RATIO: 3.3 (calc) (ref <5.0)
Cholesterol: 221 mg/dL — ABNORMAL HIGH (ref <200)
HDL: 68 mg/dL (ref >50)
LDL CHOLESTEROL (CALC): 125 mg/dL — AB
NON-HDL CHOLESTEROL (CALC): 153 mg/dL — AB (ref <130)
TRIGLYCERIDES: 162 mg/dL — AB (ref <150)

## 2017-02-07 LAB — COMPREHENSIVE METABOLIC PANEL
AG Ratio: 1.3 (calc) (ref 1.0–2.5)
ALBUMIN MSPROF: 3.6 g/dL (ref 3.6–5.1)
ALT: 7 U/L (ref 6–29)
AST: 12 U/L (ref 10–30)
Alkaline phosphatase (APISO): 81 U/L (ref 33–115)
BUN / CREAT RATIO: 15 (calc) (ref 6–22)
BUN: 7 mg/dL (ref 7–25)
CO2: 25 mmol/L (ref 20–32)
CREATININE: 0.46 mg/dL — AB (ref 0.50–1.10)
Calcium: 8.7 mg/dL (ref 8.6–10.2)
Chloride: 104 mmol/L (ref 98–110)
Globulin: 2.7 g/dL (calc) (ref 1.9–3.7)
Glucose, Bld: 73 mg/dL (ref 65–99)
POTASSIUM: 4.1 mmol/L (ref 3.5–5.3)
SODIUM: 138 mmol/L (ref 135–146)
TOTAL PROTEIN: 6.3 g/dL (ref 6.1–8.1)
Total Bilirubin: 0.2 mg/dL (ref 0.2–1.2)

## 2017-02-07 LAB — TSH: TSH: 0.73 m[IU]/L

## 2017-02-22 DIAGNOSIS — Z23 Encounter for immunization: Secondary | ICD-10-CM | POA: Diagnosis not present

## 2017-03-28 NOTE — L&D Delivery Note (Signed)
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  Presentation: vertex; Position: Occiput,, Anterior; Station: +3.  Verbal consent: obtained from patient.  Risks and benefits discussed in detail.  Risks include, but are not limited to the risks of anesthesia, bleeding, infection, damage to maternal tissues, fetal cephalhematoma.  There is also the risk of inability to effect vaginal delivery of the head, or shoulder dystocia that cannot be resolved by established maneuvers, leading to the need for emergency cesarean section.  Indication: terminal bradycardia APGAR: 8, 9; weight 7 lb 3.3 oz (3269 g).   Placenta status: spontaneous, intact not sent , .   Cord: CAN x 2 reducible with the following complications: .none  Cord pH: none  Anesthesia:  Epidural, local Instruments: kiwi Episiotomy: None Lacerations: 2nd degree;Vaginal;Sulcus;Perineal Suture Repair: 3.0 chromic Est. Blood Loss (mL): 250  Mom to postpartum.  Baby to Couplet care / Skin to Skin.  Ann Morris A Rhina Kramme 05/02/2017, 6:14 PM

## 2017-04-06 LAB — OB RESULTS CONSOLE GBS: STREP GROUP B AG: NEGATIVE

## 2017-04-10 DIAGNOSIS — Z3A36 36 weeks gestation of pregnancy: Secondary | ICD-10-CM | POA: Diagnosis not present

## 2017-04-10 DIAGNOSIS — O4403 Placenta previa specified as without hemorrhage, third trimester: Secondary | ICD-10-CM | POA: Diagnosis not present

## 2017-04-26 ENCOUNTER — Other Ambulatory Visit: Payer: Self-pay | Admitting: Obstetrics and Gynecology

## 2017-04-27 ENCOUNTER — Telehealth (HOSPITAL_COMMUNITY): Payer: Self-pay | Admitting: *Deleted

## 2017-04-27 ENCOUNTER — Encounter (HOSPITAL_COMMUNITY): Payer: Self-pay | Admitting: *Deleted

## 2017-04-27 NOTE — Telephone Encounter (Signed)
Preadmission screen  

## 2017-04-28 DIAGNOSIS — Z3A39 39 weeks gestation of pregnancy: Secondary | ICD-10-CM | POA: Diagnosis not present

## 2017-04-28 DIAGNOSIS — O4403 Placenta previa specified as without hemorrhage, third trimester: Secondary | ICD-10-CM | POA: Diagnosis not present

## 2017-05-01 ENCOUNTER — Encounter (HOSPITAL_COMMUNITY): Payer: Self-pay | Admitting: *Deleted

## 2017-05-01 ENCOUNTER — Telehealth (HOSPITAL_COMMUNITY): Payer: Self-pay | Admitting: *Deleted

## 2017-05-01 NOTE — Telephone Encounter (Signed)
Preadmission screen  

## 2017-05-02 ENCOUNTER — Inpatient Hospital Stay (HOSPITAL_COMMUNITY): Payer: 59 | Admitting: Anesthesiology

## 2017-05-02 ENCOUNTER — Encounter (HOSPITAL_COMMUNITY): Payer: Self-pay

## 2017-05-02 ENCOUNTER — Inpatient Hospital Stay (HOSPITAL_COMMUNITY)
Admission: RE | Admit: 2017-05-02 | Discharge: 2017-05-04 | DRG: 807 | Disposition: A | Discharge status: Home / Self Care | Payer: 59 | Source: Ambulatory Visit | Attending: Obstetrics and Gynecology | Admitting: Obstetrics and Gynecology

## 2017-05-02 DIAGNOSIS — Z3490 Encounter for supervision of normal pregnancy, unspecified, unspecified trimester: Secondary | ICD-10-CM | POA: Diagnosis present

## 2017-05-02 DIAGNOSIS — Z87891 Personal history of nicotine dependence: Secondary | ICD-10-CM

## 2017-05-02 DIAGNOSIS — Z0000 Encounter for general adult medical examination without abnormal findings: Secondary | ICD-10-CM | POA: Diagnosis present

## 2017-05-02 DIAGNOSIS — O99214 Obesity complicating childbirth: Secondary | ICD-10-CM | POA: Diagnosis present

## 2017-05-02 DIAGNOSIS — O99344 Other mental disorders complicating childbirth: Secondary | ICD-10-CM | POA: Diagnosis not present

## 2017-05-02 DIAGNOSIS — Z3A40 40 weeks gestation of pregnancy: Secondary | ICD-10-CM | POA: Diagnosis not present

## 2017-05-02 DIAGNOSIS — Z3A39 39 weeks gestation of pregnancy: Secondary | ICD-10-CM | POA: Diagnosis not present

## 2017-05-02 DIAGNOSIS — F329 Major depressive disorder, single episode, unspecified: Secondary | ICD-10-CM | POA: Diagnosis present

## 2017-05-02 DIAGNOSIS — Z3483 Encounter for supervision of other normal pregnancy, third trimester: Secondary | ICD-10-CM | POA: Diagnosis not present

## 2017-05-02 LAB — CBC
HEMATOCRIT: 36.2 % (ref 36.0–46.0)
HEMATOCRIT: 36.1 % (ref 36.0–46.0)
Hemoglobin: 11.8 g/dL — ABNORMAL LOW (ref 12.0–15.0)
Hemoglobin: 11.8 g/dL — ABNORMAL LOW (ref 12.0–15.0)
MCH: 28.9 pg (ref 26.0–34.0)
MCH: 28.9 pg (ref 26.0–34.0)
MCHC: 32.6 g/dL (ref 30.0–36.0)
MCHC: 32.7 g/dL (ref 30.0–36.0)
MCV: 88.5 fL (ref 78.0–100.0)
MCV: 88.5 fL (ref 78.0–100.0)
PLATELETS: 236 10*3/uL (ref 150–400)
PLATELETS: 220 10*3/uL (ref 150–400)
RBC: 4.09 MIL/uL (ref 3.87–5.11)
RBC: 4.08 MIL/uL (ref 3.87–5.11)
RDW: 14.8 % (ref 11.5–15.5)
RDW: 14.8 % (ref 11.5–15.5)
WBC: 7.3 10*3/uL (ref 4.0–10.5)
WBC: 14.2 10*3/uL — AB (ref 4.0–10.5)

## 2017-05-02 LAB — COMPREHENSIVE METABOLIC PANEL
ALBUMIN: 2.7 g/dL — AB (ref 3.5–5.0)
ALT: 16 U/L (ref 14–54)
ANION GAP: 8 (ref 5–15)
AST: 22 U/L (ref 15–41)
Alkaline Phosphatase: 148 U/L — ABNORMAL HIGH (ref 38–126)
BILIRUBIN TOTAL: 0.3 mg/dL (ref 0.3–1.2)
BUN: 5 mg/dL — AB (ref 6–20)
CHLORIDE: 106 mmol/L (ref 101–111)
CO2: 21 mmol/L — ABNORMAL LOW (ref 22–32)
Calcium: 8.4 mg/dL — ABNORMAL LOW (ref 8.9–10.3)
Creatinine, Ser: 0.41 mg/dL — ABNORMAL LOW (ref 0.44–1.00)
GFR calc Af Amer: >60 mL/min (ref >60)
GFR calc non Af Amer: >60 mL/min (ref >60)
GLUCOSE: 89 mg/dL (ref 65–99)
POTASSIUM: 3.5 mmol/L (ref 3.5–5.1)
Sodium: 135 mmol/L (ref 135–145)
TOTAL PROTEIN: 6.3 g/dL — AB (ref 6.5–8.1)

## 2017-05-02 LAB — GLUCOSE, CAPILLARY: Glucose-Capillary: 111 mg/dL — ABNORMAL HIGH (ref 65–99)

## 2017-05-02 LAB — TYPE AND SCREEN
ABO/RH(D): A POS
ANTIBODY SCREEN: NEGATIVE

## 2017-05-02 LAB — RPR: RPR Ser Ql: NONREACTIVE

## 2017-05-02 LAB — URIC ACID: URIC ACID, SERUM: 3.9 mg/dL (ref 2.3–6.6)

## 2017-05-02 MED ORDER — LIDOCAINE HCL (PF) 1 % IJ SOLN
INTRAMUSCULAR | Status: AC
  Administered 2017-05-02: 30 mL
  Filled 2017-05-02: qty 30

## 2017-05-02 MED ORDER — SOD CITRATE-CITRIC ACID 500-334 MG/5ML PO SOLN: 30.0000 mL | ORAL | Status: DC | PRN

## 2017-05-02 MED ORDER — EPHEDRINE 5 MG/ML INJ
10.0000 mg | INTRAVENOUS | Status: DC | PRN
  Filled 2017-05-02: qty 2

## 2017-05-02 MED ORDER — COCONUT OIL OIL
1.0000 "application " | TOPICAL_OIL | Status: DC | PRN
  Filled 2017-05-02: qty 120

## 2017-05-02 MED ORDER — LACTATED RINGERS IV SOLN
INTRAVENOUS | Status: DC
Start: 2017-05-02 — End: 2017-05-02
  Administered 2017-05-02: 14:00:00 via INTRAUTERINE

## 2017-05-02 MED ORDER — DIBUCAINE 1 % RE OINT: 1.0000 "application " | TOPICAL_OINTMENT | RECTAL | Status: DC | PRN

## 2017-05-02 MED ORDER — ONDANSETRON HCL 4 MG/2ML IJ SOLN: 4.0000 mg | Freq: Four times a day (QID) | INTRAMUSCULAR | Status: DC | PRN

## 2017-05-02 MED ORDER — ONDANSETRON HCL 4 MG PO TABS: 4.0000 mg | ORAL_TABLET | ORAL | Status: DC | PRN

## 2017-05-02 MED ORDER — ZOLPIDEM TARTRATE 5 MG PO TABS: 5.0000 mg | ORAL_TABLET | Freq: Every evening | ORAL | Status: DC | PRN

## 2017-05-02 MED ORDER — DIPHENHYDRAMINE HCL 50 MG/ML IJ SOLN: 12.5000 mg | INTRAMUSCULAR | Status: DC | PRN

## 2017-05-02 MED ORDER — ACETAMINOPHEN 325 MG PO TABS: 650.0000 mg | ORAL_TABLET | ORAL | Status: DC | PRN

## 2017-05-02 MED ORDER — OXYCODONE HCL 5 MG PO TABS: 5.0000 mg | ORAL_TABLET | ORAL | Status: DC | PRN

## 2017-05-02 MED ORDER — FENTANYL 2.5 MCG/ML BUPIVACAINE 1/10 % EPIDURAL INFUSION (WH - ANES)
14.0000 mL/h | INTRAMUSCULAR | Status: DC | PRN
  Administered 2017-05-02: 14 mL/h via EPIDURAL
  Filled 2017-05-02 (×2): qty 100

## 2017-05-02 MED ORDER — FERROUS SULFATE 325 (65 FE) MG PO TABS
325.0000 mg | ORAL_TABLET | Freq: Two times a day (BID) | ORAL | Status: DC
  Filled 2017-05-02: qty 1

## 2017-05-02 MED ORDER — TERBUTALINE SULFATE 1 MG/ML IJ SOLN
0.2500 mg | Freq: Once | INTRAMUSCULAR | Status: DC | PRN
  Filled 2017-05-02: qty 1

## 2017-05-02 MED ORDER — SIMETHICONE 80 MG PO CHEW: 80.0000 mg | CHEWABLE_TABLET | ORAL | Status: DC | PRN

## 2017-05-02 MED ORDER — OXYCODONE HCL 5 MG PO TABS: 10.0000 mg | ORAL_TABLET | ORAL | Status: DC | PRN

## 2017-05-02 MED ORDER — LIDOCAINE HCL (PF) 1 % IJ SOLN
INTRAMUSCULAR | Status: DC | PRN
  Administered 2017-05-02 (×2): 5 mL via EPIDURAL

## 2017-05-02 MED ORDER — LACTATED RINGERS IV SOLN: 500.0000 mL | Freq: Once | INTRAVENOUS | Status: DC

## 2017-05-02 MED ORDER — OXYTOCIN 40 UNITS IN LACTATED RINGERS INFUSION - SIMPLE MED
1.0000 m[IU]/min | INTRAVENOUS | Status: DC
  Administered 2017-05-02: 666 m[IU]/min via INTRAVENOUS
  Administered 2017-05-02: 2 m[IU]/min via INTRAVENOUS
  Filled 2017-05-02: qty 1000

## 2017-05-02 MED ORDER — SENNOSIDES-DOCUSATE SODIUM 8.6-50 MG PO TABS
2.0000 | ORAL_TABLET | ORAL | Status: DC
  Administered 2017-05-03 (×2): 2 via ORAL
  Filled 2017-05-02 (×2): qty 2

## 2017-05-02 MED ORDER — PHENYLEPHRINE 40 MCG/ML (10ML) SYRINGE FOR IV PUSH (FOR BLOOD PRESSURE SUPPORT)
80.0000 ug | PREFILLED_SYRINGE | INTRAVENOUS | Status: DC | PRN
Start: 2017-05-02 — End: 2017-05-02
  Filled 2017-05-02: qty 5
  Filled 2017-05-02: qty 10

## 2017-05-02 MED ORDER — IBUPROFEN 600 MG PO TABS
600.0000 mg | ORAL_TABLET | Freq: Four times a day (QID) | ORAL | Status: DC
  Administered 2017-05-02 – 2017-05-04 (×7): 600 mg via ORAL
  Filled 2017-05-02 (×7): qty 1

## 2017-05-02 MED ORDER — WITCH HAZEL-GLYCERIN EX PADS: 1.0000 "application " | MEDICATED_PAD | CUTANEOUS | Status: DC | PRN

## 2017-05-02 MED ORDER — LACTATED RINGERS IV SOLN
INTRAVENOUS | Status: DC
  Administered 2017-05-02: 08:00:00 via INTRAVENOUS

## 2017-05-02 MED ORDER — PHENYLEPHRINE 40 MCG/ML (10ML) SYRINGE FOR IV PUSH (FOR BLOOD PRESSURE SUPPORT)
80.0000 ug | PREFILLED_SYRINGE | INTRAVENOUS | Status: DC | PRN
  Filled 2017-05-02: qty 5
  Filled 2017-05-02: qty 10

## 2017-05-02 MED ORDER — LACTATED RINGERS IV SOLN: 500.0000 mL | INTRAVENOUS | Status: DC | PRN

## 2017-05-02 MED ORDER — ONDANSETRON HCL 4 MG/2ML IJ SOLN: 4.0000 mg | INTRAMUSCULAR | Status: DC | PRN

## 2017-05-02 MED ORDER — PRENATAL MULTIVITAMIN CH
1.0000 | ORAL_TABLET | Freq: Every day | ORAL | Status: DC
  Administered 2017-05-03: 1 via ORAL
  Filled 2017-05-02: qty 1

## 2017-05-02 MED ORDER — BENZOCAINE-MENTHOL 20-0.5 % EX AERO
1.0000 "application " | INHALATION_SPRAY | CUTANEOUS | Status: DC | PRN
  Administered 2017-05-02: 1 via TOPICAL
  Filled 2017-05-02: qty 56

## 2017-05-02 MED ORDER — SERTRALINE HCL 100 MG PO TABS
100.0000 mg | ORAL_TABLET | Freq: Every day | ORAL | Status: DC
  Administered 2017-05-02 – 2017-05-03 (×2): 100 mg via ORAL
  Filled 2017-05-02 (×2): qty 1

## 2017-05-02 MED ORDER — DIPHENHYDRAMINE HCL 25 MG PO CAPS: 25.0000 mg | ORAL_CAPSULE | Freq: Four times a day (QID) | ORAL | Status: DC | PRN

## 2017-05-02 NOTE — Anesthesia Procedure Notes (Signed)
Epidural Patient location during procedure: OB Start time: 05/02/2017 12:19 PM End time: 05/02/2017 12:29 PM  Staffing Anesthesiologist: Heather RobertsSinger, Michall Noffke, MD Performed: anesthesiologist   Preanesthetic Checklist Completed: patient identified, site marked, pre-op evaluation, timeout performed, IV checked, risks and benefits discussed and monitors and equipment checked  Epidural Patient position: sitting Prep: DuraPrep Patient monitoring: heart rate, cardiac monitor, continuous pulse ox and blood pressure Approach: midline Location: L2-L3 Injection technique: LOR saline  Needle:  Needle type: Tuohy  Needle gauge: 17 G Needle length: 9 cm Needle insertion depth: 7 cm Catheter size: 20 Guage Catheter at skin depth: 12 cm Test dose: negative and Other  Assessment Events: blood not aspirated, injection not painful, no injection resistance and negative IV test  Additional Notes Informed consent obtained prior to proceeding including risk of failure, 1% risk of PDPH, risk of minor discomfort and bruising.  Discussed rare but serious complications including epidural abscess, permanent nerve injury, epidural hematoma.  Discussed alternatives to epidural analgesia and patient desires to proceed.  Timeout performed pre-procedure verifying patient name, procedure, and platelet count.  Patient tolerated procedure well.

## 2017-05-02 NOTE — Anesthesia Pain Management Evaluation Note (Signed)
  CRNA Pain Management Visit Note  Patient: Ann MaizeCourtney Schinke, 37 y.o., female  "Hello I am a member of the anesthesia team at [facility]. We have an anesthesia team available at all times to provide care throughout the hospital, including epidural management and anesthesia for C-section. I don't know your plan for the delivery whether it a natural birth, water birth, IV sedation, nitrous supplementation, doula or epidural, but we want to meet your pain goals."   1.Was your pain managed to your expectations on prior hospitalizations?   Yes   2.What is your expectation for pain management during this hospitalization?     Epidural  3.How can we help you reach that goal? epidural  Record the patient's initial score and the patient's pain goal.   Pain: 0  Pain Goal: 4 [facility] wants you to be able to say your pain was always managed very well.  Ammi Hutt 05/02/2017

## 2017-05-02 NOTE — Progress Notes (Signed)
S; notes some ctx  O; Pitocin   Patient Vitals for the past 24 hrs:  BP Temp Temp src Pulse Resp Height Weight  05/02/17 1104 (!) 116/98 - - 74 17 - -  05/02/17 1008 107/62 - - 70 17 - -  05/02/17 0931 108/61 - - 71 17 - -  05/02/17 0857 105/66 - - 71 17 - -  05/02/17 0822 102/66 - - 82 17 - -  05/02/17 0755 98/64 97.8 F (36.6 C) Oral 91 16 - -  05/02/17 0753 - - - - - 5\' 1"  (1.549 m) 89.8 kg (198 lb)  VE 4/60/-2  Mid AROM clear fluid IUPC/ISE placed  Tracing: baseline 140 (+) variable ctx (+) accel Ctx q 2-4 mins Depression on med IMP: term gestation Latent/active phase P) cont pitocin. Right exaggerated sims. Cont zoloft

## 2017-05-02 NOTE — Anesthesia Preprocedure Evaluation (Signed)
Anesthesia Evaluation  Patient identified by MRN, date of birth, ID band Patient awake    Reviewed: Allergy & Precautions, H&P , NPO status , Patient's Chart, lab work & pertinent test results  History of Anesthesia Complications Negative for: history of anesthetic complications  Airway Mallampati: II  TM Distance: >3 FB Neck ROM: full    Dental no notable dental hx. (+) Teeth Intact   Pulmonary former smoker,    Pulmonary exam normal breath sounds clear to auscultation       Cardiovascular negative cardio ROS Normal cardiovascular exam Rhythm:regular Rate:Normal     Neuro/Psych negative neurological ROS  negative psych ROS   GI/Hepatic negative GI ROS, Neg liver ROS,   Endo/Other  Morbid obesity  Renal/GU negative Renal ROS  negative genitourinary   Musculoskeletal   Abdominal   Peds  Hematology negative hematology ROS (+)   Anesthesia Other Findings   Reproductive/Obstetrics (+) Pregnancy                             Anesthesia Physical  Anesthesia Plan  ASA: II  Anesthesia Plan: Epidural   Post-op Pain Management:    Induction:   PONV Risk Score and Plan:   Airway Management Planned: Natural Airway  Additional Equipment:   Intra-op Plan:   Post-operative Plan:   Informed Consent: I have reviewed the patients History and Physical, chart, labs and discussed the procedure including the risks, benefits and alternatives for the proposed anesthesia with the patient or authorized representative who has indicated his/her understanding and acceptance.     Plan Discussed with: Anesthesiologist  Anesthesia Plan Comments:         Anesthesia Quick Evaluation

## 2017-05-02 NOTE — H&P (Signed)
Ann Morris is Ann 37 y.o. female presenting for IOL at term with favorable cervix OB History    Gravida Para Term Preterm AB Living   2 1 1     1    SAB TAB Ectopic Multiple Live Births         0 1     Past Medical History:  Diagnosis Date  . Depression    zoloft did have issues post partum but zolft helped  . Headache   . HPV in female   . Hx of gonorrhea    2006  . Hyperlipidemia   . Postpartum care following vaginal delivery (5/18) 08/13/2015  . Vaginal Pap smear, abnormal    Past Surgical History:  Procedure Laterality Date  . COLPOSCOPY W/ BIOPSY / CURETTAGE    . WISDOM TOOTH EXTRACTION     Family History: family history includes Asthma in her father; Colon cancer in her maternal grandmother; Diabetes in her paternal grandfather; Heart attack in her paternal grandfather; Heart disease in her paternal grandfather; Hyperlipidemia in her brother; Hypertension in her mother; Thyroid disease in her sister. Social History:  reports that she quit smoking about 2 years ago. she has never used smokeless tobacco. She reports that she does not drink alcohol or use drugs.     Maternal Diabetes: No Genetic Screening: Normal Maternal Ultrasounds/Referrals: Normal Fetal Ultrasounds or other Referrals:  None Maternal Substance Abuse:  No Significant Maternal Medications:  Meds include: Zoloft Significant Maternal Lab Results:  Lab values include: Group B Strep negative Other Comments:  depression, AMA  Review of Systems  All other systems reviewed and are negative.  History Dilation: 1.5 Station: -2, -3 Exam by:: Ann ShaveYancey Luft RN  Blood pressure 107/62, pulse 70, temperature 97.8 F (36.6 C), temperature source Oral, resp. rate 17, height 5\' 1"  (1.549 m), weight 89.8 kg (198 lb), last menstrual period 07/29/2016, not currently breastfeeding. Exam Physical Exam  Constitutional: She is oriented to person, place, and time. She appears well-developed and well-nourished.  HENT:   Head: Atraumatic.  Eyes: EOM are normal.  Neck: Neck supple.  Cardiovascular: Regular rhythm.  Respiratory: Effort normal.  GI: Soft.  gravid  Neurological: She is alert and oriented to person, place, and time.  Skin: Skin is warm and dry.  Psychiatric: She has Ann normal mood and affect.    Prenatal labs: ABO, Rh: --/--/Ann POS (02/05 [id number]) Antibody: PENDING (02/05 0757) Rubella: Immune (07/03 0000) RPR: Nonreactive (07/03 0000)  HBsAg: Negative (07/03 0000)  HIV: Non-reactive (07/03 0000)  GBS: Negative (01/10 0000)   Assessment/Plan: Term gestation Depression  Controlled on med P) admit routine labs. IV pitocin. Analgesic prn. Cont zoloft   Ann Morris Ann Morris 05/02/2017, 11:05 AM

## 2017-05-03 LAB — CBC
HCT: 32.8 % — ABNORMAL LOW (ref 36.0–46.0)
Hemoglobin: 10.8 g/dL — ABNORMAL LOW (ref 12.0–15.0)
MCH: 29.2 pg (ref 26.0–34.0)
MCHC: 32.9 g/dL (ref 30.0–36.0)
MCV: 88.6 fL (ref 78.0–100.0)
PLATELETS: 223 10*3/uL (ref 150–400)
RBC: 3.7 MIL/uL — ABNORMAL LOW (ref 3.87–5.11)
RDW: 14.8 % (ref 11.5–15.5)
WBC: 8.9 10*3/uL (ref 4.0–10.5)

## 2017-05-03 NOTE — Progress Notes (Signed)
PPD 1 VAVD with 2nd degree repair  S:  Reports feeling well             Tolerating po/ No nausea or vomiting             Bleeding is light             Pain controlled with motrin             Up ad lib / ambulatory / voiding QS  Newborn Breast O:               VS: BP (!) 106/54   Pulse 88   Temp 98.5 F (36.9 C)   Resp 18   Ht 5\' 1"  (1.549 m)   Wt 89.8 kg (198 lb)   LMP 07/29/2016   SpO2 100%   BMI 37.41 kg/m    LABS:              Recent Labs    05/02/17 1838 05/03/17 0534  WBC 14.2* 8.9  HGB 11.8* 10.8*  PLT 220 223               Blood type: --/--/A POS (02/05 0757)  Rubella: Immune (07/03 0000)              Flu and tdap current                     I&O: Intake/Output      02/05 0701 - 02/06 0700 02/06 0701 - 02/07 0700   Urine (mL/kg/hr) 750    Blood 250    Total Output 1000    Net -1000                    Physical Exam:             Alert and oriented X3  Abdomen: soft, non-tender, non-distended              Fundus: firm, non-tender, Ueven  Perineum: ice pack in place  Lochia: light to moderate amount  Extremities: no edema, no calf pain or tenderness    A: PPD # 2 VAVD with 2nd degree repair   Doing well - stable status  P: Routine post partum orders   Marlinda Mikeanya Latrisha Coiro CNM, MSN, [facility] 05/03/2017, 8:51 AM

## 2017-05-03 NOTE — Plan of Care (Signed)
Pt. Will continue to less stressful with situations

## 2017-05-03 NOTE — Lactation Note (Signed)
This note was copied from a baby's chart. Lactation Consultation Note  Patient Name: Ann Eustaquio MaizeCourtney Rowzee RUEAV'WToday's Date: 05/03/2017 Reason for consult: Initial assessment;Term Breastfeeding consultation services and support information given and reviewed.  Mom states she did not have a good experience with her first baby.  She had a painful latch and pumped and bottle fed for 6 weeks.  Mom states she doesn't know how breastfeeding will go with a toddler so she plans to also give formula.  Mom reports that newborn is latching well and she is comfortable with feeding.  She will feed on both sides and then offer formula.  Encouraged to call for feeding assist/concerns prn.  Maternal Data Does the patient have breastfeeding experience prior to this delivery?: Yes  Feeding    LATCH Score                   Interventions    Lactation Tools Discussed/Used     Consult Status Consult Status: Follow-up Date: 05/04/17 Follow-up type: In-patient    Huston FoleyMOULDEN, Jaimee Corum S 05/03/2017, 11:37 AM

## 2017-05-03 NOTE — Progress Notes (Signed)
MOB was referred for history of depression/anxiety. * Referral screened out by Clinical Social Worker because none of the following criteria appear to apply: ~ History of anxiety/depression during this pregnancy, or of post-partum depression. ~ Diagnosis of anxiety and/or depression within last 3 years OR * MOB's symptoms currently being treated with medication and/or therapy. Please contact the Clinical Social Worker if needs arise, by [facility], or if MOB scores greater than 9/yes to question 10 on Edinburgh Postpartum Depression Screen.  MOB has Rx for Zoloft.  PNR states depression controlled with Zoloft and patient coping well.

## 2017-05-03 NOTE — Anesthesia Postprocedure Evaluation (Signed)
Anesthesia Post Note  Patient: Ann MaizeCourtney Morris  Procedure(s) Performed: AN AD HOC LABOR EPIDURAL     Patient location during evaluation: Mother Baby Anesthesia Type: Epidural Level of consciousness: awake Pain management: pain level controlled Vital Signs Assessment: post-procedure vital signs reviewed and stable Respiratory status: spontaneous breathing Cardiovascular status: stable Postop Assessment: no headache, no backache, epidural receding, patient able to bend at knees, no apparent nausea or vomiting and adequate PO intake Anesthetic complications: no    Last Vitals:  Vitals:   05/02/17 2007 05/03/17 0015  BP: 106/66 (!) 106/54  Pulse: 75 88  Resp: 20 18  Temp: 36.6 C 36.9 C  SpO2: 99% 100%    Last Pain:  Vitals:   05/03/17 0530  TempSrc:   PainSc: Asleep   Pain Goal:                 [name]

## 2017-05-04 MED ORDER — IBUPROFEN 600 MG PO TABS: 600.0000 mg | ORAL_TABLET | Freq: Four times a day (QID) | ORAL | 0 refills | Status: DC

## 2017-05-04 NOTE — Lactation Note (Signed)
This note was copied from a baby's chart. Lactation Consultation Note  Patient Name: Ann Eustaquio MaizeCourtney Morris WUJWJ'XToday's Date: 05/04/2017  Pecola LeisureBaby is 41 hours old and at a 6 % weight loss.  Mom has given small amounts of formula three times.  She states baby is breastfeeding well.  Instructed to feed with cues.  Lactation outpatient services and support reviewed and encouraged prn.   Maternal Data    Feeding Feeding Type: Breast Fed Length of feed: 3 min  LATCH Score                   Interventions    Lactation Tools Discussed/Used     Consult Status      Huston FoleyMOULDEN, Exodus Kutzer S 05/04/2017, 10:21 AM

## 2017-05-04 NOTE — Progress Notes (Signed)
D/c instructions for mom & baby discussed with pt & husband. Prescriptions reviewed & [facility] d/c instructions given.  Pt signed instructions for herself & infant; verb understanding.  Pt to be d/c home in stable condition with husband.

## 2017-05-04 NOTE — Discharge Summary (Signed)
Obstetric Discharge Summary Reason for Admission: induction of labor - elective with favorable Bishop score Prenatal Procedures: none Intrapartum Procedures: epidural,  vac-assisted vaginal delivery for fetal bradycardia Postpartum Procedures: none Complications-Operative and Postpartum: 2nd degree perineal laceration Hemoglobin  Date Value Ref Range Status  05/03/2017 10.8 (L) 12.0 - 15.0 g/dL Final   HCT  Date Value Ref Range Status  05/03/2017 32.8 (L) 36.0 - 46.0 % Final    Physical Exam:  General: alert, cooperative and no distress Lochia: appropriate Uterine Fundus: firm Incision: healing well DVT Evaluation: No evidence of DVT seen on physical exam.  Discharge Diagnoses: Term Pregnancy-delivered, depression  Discharge Information: Date: 05/04/2017 Activity: pelvic rest Diet: routine Medications: PNV, Ibuprofen and magnesium, zoloft Condition: stable Instructions: refer to practice specific booklet Discharge to: home Follow-up Information    Maxie Betterousins, Silva Aamodt, MD. Schedule an appointment as soon as possible for a visit in 6 week(s).   Specialty:  Obstetrics and Gynecology Contact information: [address] [place] [id number] [phone]           Newborn Data: Live born female  Birth Weight: 7 lb 3.3 oz (3269 g) APGAR: 8, 9  Newborn Delivery   Birth date/time:  05/02/2017 16:52:00 Delivery type:  Vaginal, Vacuum (Extractor)     Home with mother.  Marlinda Mikeanya Bailey 05/04/2017, 9:05 AM

## 2017-05-04 NOTE — Progress Notes (Signed)
PPD 2 SVD  S:  Reports feeling well - ready to go home             Tolerating po/ No nausea or vomiting             Bleeding is light             Pain controlled with motrin             Up ad lib / ambulatory / voiding QS  Newborn Breast  O:               VS: BP 128/77   Pulse 70   Temp 98.7 F (37.1 C) (Oral)   Resp 19   Ht 5\' 1"  (1.549 m)   Wt 89.8 kg (198 lb)   LMP 07/29/2016   SpO2 98%   BMI 37.41 kg/m    LABS:              Recent Labs    05/02/17 1838 05/03/17 0534  WBC 14.2* 8.9  HGB 11.8* 10.8*  PLT 220 223               Blood type: --/--/A POS (02/05 0757)  Rubella: Immune (07/03 0000)                          Physical Exam:             Alert and oriented X3  Abdomen: soft, non-tender, non-distended              Fundus: firm, non-tender, Ueven  Perineum: no edema  Lochia: light  Extremities: trace dependent edema (hands not feet), no calf pain or tenderness, wearing TED hose    A: PPD # 2   Doing well - stable status  P: Routine post partum orders  DC home - [facility] booklet - instructions reveiwed  Marlinda Mikeanya Vonetta Foulk CNM, MSN, [facility] 05/04/2017, 9:02 AM

## 2017-05-05 ENCOUNTER — Inpatient Hospital Stay (HOSPITAL_COMMUNITY): Admission: AD | Admit: 2017-05-05 | Payer: 59 | Source: Ambulatory Visit | Admitting: Obstetrics and Gynecology

## 2017-05-14 ENCOUNTER — Encounter (HOSPITAL_COMMUNITY): Payer: Self-pay

## 2017-06-08 DIAGNOSIS — Z130 Encounter for screening for diseases of the blood and blood-forming organs and certain disorders involving the immune mechanism: Secondary | ICD-10-CM | POA: Diagnosis not present

## 2017-06-27 ENCOUNTER — Encounter: Payer: Self-pay | Admitting: Emergency Medicine

## 2017-06-27 ENCOUNTER — Ambulatory Visit: Payer: Self-pay | Admitting: Emergency Medicine

## 2017-06-27 VITALS — BP 100/70 | HR 87 | Temp 98.8°F | Wt 188.2 lb

## 2017-06-27 DIAGNOSIS — J0100 Acute maxillary sinusitis, unspecified: Secondary | ICD-10-CM

## 2017-06-27 MED ORDER — AMOXICILLIN-POT CLAVULANATE 875-125 MG PO TABS: 1.0000 | ORAL_TABLET | Freq: Two times a day (BID) | ORAL | 0 refills | Status: DC

## 2017-06-27 NOTE — Patient Instructions (Signed)

## 2017-06-27 NOTE — Progress Notes (Signed)
Subjective:     Ann MaizeCourtney Morris is a 37 y.o. female who presents for evaluation of sinus pain. Symptoms include: cough, facial pain, foul rhinorrhea and sinus pressure. Onset of symptoms was 2 weeks ago. Symptoms have been gradually worsening since that time. Past history is significant for no history of pneumonia or bronchitis. Patient is a non-smoker. Symptoms unrelieved with OTC therapies     Review of Systems Pertinent items are noted in HPI.   Objective:    BP 100/70   Pulse 87   Temp 98.8 F (37.1 C)   Wt 188 lb 3.2 oz (85.4 kg)   LMP 07/29/2016   SpO2 98%   BMI 35.56 kg/m  General appearance: alert, cooperative and appears stated age Head: Normocephalic, without obvious abnormality, atraumatic Ears: normal TM's and external ear canals both ears Nose: sinus tenderness bilateral Throat: lips, mucosa, and tongue normal; teeth and gums normal Neck: mild anterior cervical adenopathy Lungs: clear to auscultation bilaterally Heart: regular rate and rhythm Pulses: 2+ and symmetric Skin: Skin color, texture, turgor normal. No rashes or lesions    Assessment:    Acute bacterial sinusitis.    Plan:    Augmentin per medication orders. Follow up in 1 week or as needed. OTC Flonase daily, decongestant of choice daily

## 2017-07-07 ENCOUNTER — Telehealth: Payer: Self-pay

## 2017-07-07 NOTE — Telephone Encounter (Signed)
Called and followed up with pt and she states she is doing much better.

## 2017-07-18 ENCOUNTER — Encounter: Payer: Self-pay | Admitting: Family Medicine

## 2017-08-14 ENCOUNTER — Ambulatory Visit: Payer: 59 | Admitting: Family Medicine

## 2017-08-14 ENCOUNTER — Encounter: Payer: Self-pay | Admitting: Family Medicine

## 2017-08-14 VITALS — BP 124/80 | HR 75 | Temp 98.6°F | Ht 61.0 in | Wt 193.8 lb

## 2017-08-14 DIAGNOSIS — F329 Major depressive disorder, single episode, unspecified: Secondary | ICD-10-CM

## 2017-08-14 DIAGNOSIS — F988 Other specified behavioral and emotional disorders with onset usually occurring in childhood and adolescence: Secondary | ICD-10-CM | POA: Diagnosis not present

## 2017-08-14 DIAGNOSIS — F32A Depression, unspecified: Secondary | ICD-10-CM

## 2017-08-14 HISTORY — DX: Other specified behavioral and emotional disorders with onset usually occurring in childhood and adolescence: F98.8

## 2017-08-14 MED ORDER — AMPHETAMINE-DEXTROAMPHETAMINE 20 MG PO TABS: 20.0000 mg | ORAL_TABLET | Freq: Every day | ORAL | 0 refills | Status: DC

## 2017-08-14 NOTE — Patient Instructions (Signed)
Call me next week and let me know if the medication is working, how long is the medication working and are you having any side effects. Follow-up with me in 4 weeks

## 2017-08-14 NOTE — Progress Notes (Signed)
   Subjective:    Patient ID: Ann Morris, female    DOB: [birth year], 37 y.o.   MRN: [id number]  HPI Chief Complaint  Patient presents with  . other    med  consult   She is here to discuss starting back on Adderall for ADD.  Medication in the past was prescribed by her psychiatrist Dr. Evelene Croon but they no longer accept her insurance.  States she was diagnosed in 3rd grade with ADD.  She initially took Ritalin until fifth grade and then switched to Adderall.  States she stopped the medication last year due to pregnancy.  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  She reports having difficulty staying on task and getting her work completed. Her child was born in February and she is not breast-feeding. States she has always taken the short acting medication and typically only takes it 3 to 4 days/week once daily  She brought in documentation today from when she was diagnosed in 1997. Dr Ferd Glassing at [facility]. Notes from 11/28/1995.  Dysgraphia and math learning disability as well.  Went to tutoring several years ago.   Has been on sertraline since senior in HS. She took this during pregnancy.  She would like for me to refill this when she is due for refill.  Previously her OB/GYN was refilling the medication for her.  Denies fever, chills, dizziness, chest pain, palpitations, shortness of breath, N/V/D.  Reviewed allergies, medications, past medical, surgical, family, and social history.    Review of Systems Pertinent positives and negatives in the history of present illness.     Objective:   Physical Exam BP 124/80 (BP Location: Left Arm, Patient Position: Sitting)   Pulse 75   Temp 98.6 F (37 C)   Ht  (1.549 m)   Wt 193 lb 12.8 oz (87.9 kg)   LMP 07/29/2017 Comment: husband vasectomy   SpO2 98%   Breastfeeding? No   BMI 36.62 kg/m   Alert and oriented and in no acute distress. Not otherwise  examined.       Assessment & Plan:  ADD (attention deficit disorder) without hyperactivity - Plan: amphetamine-dextroamphetamine (ADDERALL) 20 MG tablet  Depression, unspecified depression type  Reviewed documentation from her initial diagnosis of ADD.  I will start her back on Adderall once daily and see how she is doing next week.  She will call and let me know if the medication is working, how long it is working and if she is having any side effects. I will see her back in 4 weeks. Her mood is stable and I will refill sertraline when she is due.

## 2017-08-20 ENCOUNTER — Telehealth: Payer: Self-pay | Admitting: Medical

## 2017-08-20 NOTE — Telephone Encounter (Signed)
P.A. DEX-AMPHETAMINE

## 2017-08-23 ENCOUNTER — Encounter: Payer: Self-pay | Admitting: Family Medicine

## 2017-08-23 NOTE — Telephone Encounter (Signed)
P.A. Approved til 08/21/18, called pharmacy and went thru for $15, called pt and informed

## 2017-09-11 ENCOUNTER — Ambulatory Visit: Payer: 59 | Admitting: Family Medicine

## 2017-09-11 ENCOUNTER — Ambulatory Visit: Payer: Self-pay | Admitting: Family Medicine

## 2017-09-14 ENCOUNTER — Encounter: Payer: Self-pay | Admitting: Family Medicine

## 2017-09-14 ENCOUNTER — Ambulatory Visit: Payer: 59 | Admitting: Family Medicine

## 2017-09-14 VITALS — BP 110/76 | HR 90 | Temp 98.3°F | Resp 16 | Ht 61.0 in | Wt 196.0 lb

## 2017-09-14 DIAGNOSIS — J069 Acute upper respiratory infection, unspecified: Secondary | ICD-10-CM

## 2017-09-14 DIAGNOSIS — F988 Other specified behavioral and emotional disorders with onset usually occurring in childhood and adolescence: Secondary | ICD-10-CM

## 2017-09-14 DIAGNOSIS — R059 Cough, unspecified: Secondary | ICD-10-CM

## 2017-09-14 DIAGNOSIS — R05 Cough: Secondary | ICD-10-CM

## 2017-09-14 DIAGNOSIS — F329 Major depressive disorder, single episode, unspecified: Secondary | ICD-10-CM

## 2017-09-14 DIAGNOSIS — F32A Depression, unspecified: Secondary | ICD-10-CM

## 2017-09-14 NOTE — Progress Notes (Signed)
   Subjective:    Patient ID: Ann Morris, female    DOB: 11/08/1980, 37 y.o.   MRN: [id number]  HPI Chief Complaint  Patient presents with  . med check    med check   cough X2 weeks   She is here for a 4 week follow up on ADD medication. She also has a new concern today.   Has been on Adderall for the past 2 weeks. Reports this is working well for her.  Taking it in the morning. Works 4-5 hours. No side effects.   Contraception - husband had a vasectomy.   Depression- stable on Zoloft.   Cough for 2 weeks. Getting some better but cough and chest congestion lingering. Denies fever, chills, headache, sinus pain, nasal congestion, chest pain, palpitations, shortness of breath, abdominal pain, N/V/D.   Reviewed allergies, medications, past medical, surgical, family, and social history.   Review of Systems Pertinent positives and negatives in the history of present illness.     Objective:   Physical Exam BP 110/76   Pulse 90   Temp 98.3 F (36.8 C) (Oral)   Resp 16   Ht 5\' 1"  (1.549 m)   Wt 196 lb (88.9 kg)   SpO2 97%   Breastfeeding? No   BMI 37.03 kg/m   .Alert and in no distress. No sinus tenderness. Tympanic membranes and canals are normal. Pharyngeal area is normal. Neck is supple without adenopathy or thyromegaly. Cardiac exam shows a regular sinus rhythm without murmurs or gallops. Lungs are clear to auscultation.       Assessment & Plan:  ADD (attention deficit disorder) without hyperactivity  Depression, unspecified depression type  Cough  Acute URI  Doing well on Adderall. Continue on current dose. Refill when needed for 3 months.  Continue on Zoloft for depression.  Acute URI and cough. Symptomatic treatment with Mucinex or Robitussion as long as she continues to improve, if not she will call.

## 2017-10-10 ENCOUNTER — Encounter: Payer: Self-pay | Admitting: Family Medicine

## 2017-10-10 ENCOUNTER — Other Ambulatory Visit: Payer: Self-pay | Admitting: Family Medicine

## 2017-10-10 DIAGNOSIS — F988 Other specified behavioral and emotional disorders with onset usually occurring in childhood and adolescence: Secondary | ICD-10-CM

## 2017-10-10 MED ORDER — AMPHETAMINE-DEXTROAMPHETAMINE 20 MG PO TABS: 20.0000 mg | ORAL_TABLET | Freq: Every day | ORAL | 0 refills | Status: DC

## 2017-10-10 MED ORDER — SERTRALINE HCL 100 MG PO TABS: 100.0000 mg | ORAL_TABLET | Freq: Every day | ORAL | 2 refills | Status: DC

## 2017-10-10 NOTE — Telephone Encounter (Signed)
Please sign orders.

## 2017-10-10 NOTE — Telephone Encounter (Signed)
Is this okay to refill? 

## 2017-10-10 NOTE — Telephone Encounter (Signed)
Ok to refill for 3 months.  

## 2017-11-23 DIAGNOSIS — L811 Chloasma: Secondary | ICD-10-CM | POA: Diagnosis not present

## 2017-12-20 ENCOUNTER — Ambulatory Visit: Payer: Self-pay | Admitting: Family Medicine

## 2018-02-14 ENCOUNTER — Other Ambulatory Visit: Payer: Self-pay | Admitting: Family Medicine

## 2018-02-15 ENCOUNTER — Encounter: Payer: Self-pay | Admitting: Family Medicine

## 2018-02-15 ENCOUNTER — Ambulatory Visit: Payer: 59 | Admitting: Family Medicine

## 2018-02-15 VITALS — BP 128/80 | HR 67 | Ht 61.0 in | Wt 195.9 lb

## 2018-02-15 DIAGNOSIS — E782 Mixed hyperlipidemia: Secondary | ICD-10-CM

## 2018-02-15 DIAGNOSIS — E669 Obesity, unspecified: Secondary | ICD-10-CM | POA: Diagnosis not present

## 2018-02-15 DIAGNOSIS — F329 Major depressive disorder, single episode, unspecified: Secondary | ICD-10-CM | POA: Diagnosis not present

## 2018-02-15 DIAGNOSIS — F988 Other specified behavioral and emotional disorders with onset usually occurring in childhood and adolescence: Secondary | ICD-10-CM

## 2018-02-15 DIAGNOSIS — D649 Anemia, unspecified: Secondary | ICD-10-CM | POA: Diagnosis not present

## 2018-02-15 DIAGNOSIS — Z0000 Encounter for general adult medical examination without abnormal findings: Secondary | ICD-10-CM

## 2018-02-15 DIAGNOSIS — F32A Depression, unspecified: Secondary | ICD-10-CM

## 2018-02-15 LAB — POCT URINALYSIS DIP (PROADVANTAGE DEVICE)
BILIRUBIN UA: NEGATIVE
GLUCOSE UA: NEGATIVE mg/dL
Ketones, POC UA: NEGATIVE mg/dL
Leukocytes, UA: NEGATIVE
Nitrite, UA: NEGATIVE
Protein Ur, POC: NEGATIVE mg/dL
SPECIFIC GRAVITY, URINE: 1.025
Urobilinogen, Ur: NEGATIVE
pH, UA: 6 (ref 5.0–8.0)

## 2018-02-15 NOTE — Addendum Note (Signed)
Addended by: Herminio CommonsJOHNSON, SABRINA A on: 02/15/2018 10:34 AM   Modules accepted: Orders

## 2018-02-15 NOTE — Patient Instructions (Signed)
It was a pleasure seeing you today.   Cut back on sweets, carbohydrates and fatty foods.  Increase whole grains, fruits, vegetables and lean meat or fish.   Try to get at least 150 minutes of physical activity per week beyond your regular activities.   We will call with your lab results.    Preventative Care for Adults - Female      MAINTAIN REGULAR HEALTH EXAMS:  A routine yearly physical is a good way to check in with your primary care provider about your health and preventive screening. It is also an opportunity to share updates about your health and any concerns you have, and receive a thorough all-over exam.   Most health insurance companies pay for at least some preventative services.  Check with your health plan for specific coverages.  WHAT PREVENTATIVE SERVICES DO WOMEN NEED?  Adult women should have their weight and blood pressure checked regularly.   Women age 38 and older should have their cholesterol levels checked regularly.  Women should be screened for cervical cancer with a Pap smear and pelvic exam beginning at age 38.  Breast cancer screening generally begins at age 38 with a mammogram and breast exam by your primary care provider.    Beginning at age 38 and continuing to age 38, women should be screened for colorectal cancer.  Certain people may need continued testing until age 38.  Updating vaccinations is part of preventative care.  Vaccinations help protect against diseases such as the flu.  Osteoporosis is a disease in which the bones lose minerals and strength as we age. Women ages 1665 and over should discuss this with their caregivers, as should women after menopause who have other risk factors.  Lab tests are generally done as part of preventative care to screen for anemia and blood disorders, to screen for problems with the kidneys and liver, to screen for bladder problems, to check blood sugar, and to check your cholesterol level.  Preventative services  generally include counseling about diet, exercise, avoiding tobacco, drugs, excessive alcohol consumption, and sexually transmitted infections.    GENERAL RECOMMENDATIONS FOR GOOD HEALTH:  Healthy diet:  Eat a variety of foods, including fruit, vegetables, animal or vegetable protein, such as meat, fish, chicken, and eggs, or beans, lentils, tofu, and grains, such as rice.  Drink plenty of water daily.  Decrease saturated fat in the diet, avoid lots of red meat, processed foods, sweets, fast foods, and fried foods.  Exercise:  Aerobic exercise helps maintain good heart health. At least 30-40 minutes of moderate-intensity exercise is recommended. For example, a brisk walk that increases your heart rate and breathing. This should be done on most days of the week.   Find a type of exercise or a variety of exercises that you enjoy so that it becomes a part of your daily life.  Examples are running, walking, swimming, water aerobics, and biking.  For motivation and support, explore group exercise such as aerobic class, spin class, Zumba, Yoga,or  martial arts, etc.    Set exercise goals for yourself, such as a certain weight goal, walk or run in a race such as a 5k walk/run.  Speak to your primary care provider about exercise goals.  Disease prevention:  If you smoke or chew tobacco, find out from your caregiver how to quit. It can literally save your life, no matter how long you have been a tobacco user. If you do not use tobacco, never begin.   Maintain  a healthy diet and normal weight. Increased weight leads to problems with blood pressure and diabetes.   The Body Mass Index or BMI is a way of measuring how much of your body is fat. Having a BMI above 27 increases the risk of heart disease, diabetes, hypertension, stroke and other problems related to obesity. Your caregiver can help determine your BMI and based on it develop an exercise and dietary program to help you achieve or maintain this  important measurement at a healthful level.  High blood pressure causes heart and blood vessel problems.  Persistent high blood pressure should be treated with medicine if weight loss and exercise do not work.   Fat and cholesterol leaves deposits in your arteries that can block them. This causes heart disease and vessel disease elsewhere in your body.  If your cholesterol is found to be high, or if you have heart disease or certain other medical conditions, then you may need to have your cholesterol monitored frequently and be treated with medication.   Ask if you should have a cardiac stress test if your history suggests this. A stress test is a test done on a treadmill that looks for heart disease. This test can find disease prior to there being a problem.  Menopause can be associated with physical symptoms and risks. Hormone replacement therapy is available to decrease these. You should talk to your caregiver about whether starting or continuing to take hormones is right for you.   Osteoporosis is a disease in which the bones lose minerals and strength as we age. This can result in serious bone fractures. Risk of osteoporosis can be identified using a bone density scan. Women ages 42 and over should discuss this with their caregivers, as should women after menopause who have other risk factors. Ask your caregiver whether you should be taking a calcium supplement and Vitamin D, to reduce the rate of osteoporosis.   Avoid drinking alcohol in excess (more than two drinks per day).  Avoid use of street drugs. Do not share needles with anyone. Ask for professional help if you need assistance or instructions on stopping the use of alcohol, cigarettes, and/or drugs.  Brush your teeth twice a day with fluoride toothpaste, and floss once a day. Good oral hygiene prevents tooth decay and gum disease. The problems can be painful, unattractive, and can cause other health problems. Visit your dentist for a  routine oral and dental check up and preventive care every 6-12 months.   Look at your skin regularly.  Use a mirror to look at your back. Notify your caregivers of changes in moles, especially if there are changes in shapes, colors, a size larger than a pencil eraser, an irregular border, or development of new moles.  Safety:  Use seatbelts 100% of the time, whether driving or as a passenger.  Use safety devices such as hearing protection if you work in environments with loud noise or significant background noise.  Use safety glasses when doing any work that could send debris in to the eyes.  Use a helmet if you ride a bike or motorcycle.  Use appropriate safety gear for contact sports.  Talk to your caregiver about gun safety.  Use sunscreen with a SPF (or skin protection factor) of 15 or greater.  Lighter skinned people are at a greater risk of skin cancer. Don't forget to also wear sunglasses in order to protect your eyes from too much damaging sunlight. Damaging sunlight can accelerate cataract formation.  Practice safe sex. Use condoms. Condoms are used for birth control and to help reduce the spread of sexually transmitted infections (or STIs).  Some of the STIs are gonorrhea (the clap), chlamydia, syphilis, trichomonas, herpes, HPV (human papilloma virus) and HIV (human immunodeficiency virus) which causes AIDS. The herpes, HIV and HPV are viral illnesses that have no cure. These can result in disability, cancer and death.   Keep carbon monoxide and smoke detectors in your home functioning at all times. Change the batteries every 6 months or use a model that plugs into the wall.   Vaccinations:  Stay up to date with your tetanus shots and other required immunizations. You should have a booster for tetanus every 10 years. Be sure to get your flu shot every year, since 5%-20% of the [place] population comes down with the flu. The flu vaccine changes each year, so being vaccinated once is not  enough. Get your shot in the fall, before the flu season peaks.   Other vaccines to consider:  Human Papilloma Virus or HPV causes cancer of the cervix, and other infections that can be transmitted from person to person. There is a vaccine for HPV, and females should get immunized between the ages of 39 and 24. It requires a series of 3 shots.   Pneumococcal vaccine to protect against certain types of pneumonia.  This is normally recommended for adults age 27 or older.  However, adults younger than 38 years old with certain underlying conditions such as diabetes, heart or lung disease should also receive the vaccine.  Shingles vaccine to protect against Varicella Zoster if you are older than age 71, or younger than 38 years old with certain underlying illness.  Hepatitis A vaccine to protect against a form of infection of the liver by a virus acquired from food.  Hepatitis B vaccine to protect against a form of infection of the liver by a virus acquired from blood or body fluids, particularly if you work in health care.  If you plan to travel internationally, check with your local health department for specific vaccination recommendations.  Cancer Screening:  Breast cancer screening is essential to preventive care for women. All women age 71 and older should perform a breast self-exam every month. At age 2 and older, women should have their caregiver complete a breast exam each year. Women at ages 27 and older should have a mammogram (x-ray film) of the breasts. Your caregiver can discuss how often you need mammograms.    Cervical cancer screening includes taking a Pap smear (sample of cells examined under a microscope) from the cervix (end of the uterus). It also includes testing for HPV (Human Papilloma Virus, which can cause cervical cancer). Screening and a pelvic exam should begin at age 70, or 3 years after a woman becomes sexually active. Screening should occur every year, with a Pap smear  but no HPV testing, up to age 6. After age 54, you should have a Pap smear every 3 years with HPV testing, if no HPV was found previously.   Most routine colon cancer screening begins at the age of 41. On a yearly basis, doctors may provide special easy to use take-home tests to check for hidden blood in the stool. Sigmoidoscopy or colonoscopy can detect the earliest forms of colon cancer and is life saving. These tests use a small camera at the end of a tube to directly examine the colon. Speak to your caregiver about this at age 61, when  routine screening begins (and is repeated every 5 years unless early forms of pre-cancerous polyps or small growths are found).

## 2018-02-15 NOTE — Progress Notes (Signed)
Subjective:    Patient ID: Ann Morris, female    DOB: 11/06/1980, 37 y.o.   MRN: [id number]  HPI Chief Complaint  Patient presents with  . fasting cpe    fasting cpe, sees ogbyn. already got flu shot   She is here for a complete physical exam. No concerns today.  Reports feeling well and is in her usual state of health.   Other providers: OB/GYN - Dr. Etheleen Morris  Ann Morris- dentist   Taking Adderall 2 days per week. One dose on those days. It lasts 5 hours. No side effects.  States her mood is fine. Taking sertraline daily and no concerns.  No refills needed today.   History of anemia. ?pregnancy related   Social history: Lives with her husband and 2 kids. Ann ParishYoungest is daughter who is 9 months. works at [employer].  Denies smoking, drinking alcohol some days, no drug use  Diet: unhealthy  Excerise: nothing lately.   Immunizations: UTD   Health maintenance:  Mammogram: N/A Colonoscopy: N/A Last Gynecological Exam: Last Menstrual cycle: Last Dental Exam: every 4 months  Last Eye Exam: 2 months ago   Wears seatbelt always, uses sunscreen, smoke detectors in home and functioning, does not text while driving and feels safe in home environment.   Reviewed allergies, medications, past medical, surgical, family, and social history.   Review of Systems Review of Systems Constitutional: -fever, -chills, -sweats, -unexpected weight change,-fatigue ENT: -runny nose, -ear pain, -sore throat Cardiology:  -chest pain, -palpitations, -edema Respiratory: -cough, -shortness of breath, -wheezing Gastroenterology: -abdominal pain, -nausea, -vomiting, -diarrhea, -constipation  Hematology: -bleeding or bruising problems Musculoskeletal: -arthralgias, -myalgias, -joint swelling, -back pain Ophthalmology: -vision changes Urology: -dysuria, -difficulty urinating, -hematuria, -urinary frequency, -urgency Neurology: -headache, -weakness, -tingling, -numbness       Objective:     Physical Exam BP 128/80   Pulse 67   Ht 5\' 1"  (1.549 m)   Wt 195 lb 14.4 oz (88.9 kg)   LMP 01/18/2018   Breastfeeding? No   BMI 37.01 kg/m   General Appearance:    Alert, cooperative, no distress, appears stated age  Head:    Normocephalic, without obvious abnormality, atraumatic  Eyes:    PERRL, conjunctiva/corneas clear, EOM's intact, fundi    benign  Ears:    Normal TM's and external ear canals  Nose:   Nares normal, mucosa normal, no drainage or sinus   tenderness  Throat:   Lips, mucosa, and tongue normal; teeth and gums normal  Neck:   Supple, no lymphadenopathy;  thyroid:  no   enlargement/tenderness/nodules; no carotid   bruit or JVD  Back:    Spine nontender, no curvature, ROM normal, no CVA     tenderness  Lungs:     Clear to auscultation bilaterally without wheezes, rales or     ronchi; respirations unlabored  Chest Wall:    No tenderness or deformity   Heart:    Regular rate and rhythm, S1 and S2 normal, no murmur, rub   or gallop  Breast Exam:    OB/GYN  Abdomen:     Soft, non-tender, nondistended, normoactive bowel sounds,    no masses, no hepatosplenomegaly  Genitalia:    OB/GYN  Rectal:    Not performed due to age<40 and no related complaints  Extremities:   No clubbing, cyanosis or edema  Pulses:   2+ and symmetric all extremities  Skin:   Skin color, texture, turgor normal, no rashes or lesions  Lymph nodes:  Cervical, supraclavicular, and axillary nodes normal  Neurologic:   CNII-XII intact, normal strength, sensation and gait; reflexes 2+ and symmetric throughout          Psych:   Normal mood, affect, hygiene and grooming.     Urinalysis dipstick: trace blood, negative otherwise.       Assessment & Plan:  Routine general medical examination at a health care facility - Plan: CBC with Differential/Platelet, Comprehensive metabolic panel, TSH, Lipid panel  Mixed hyperlipidemia - Plan: Lipid panel  ADD (attention deficit disorder) without  hyperactivity  Depression, unspecified depression type  Obesity (BMI 35.0-39.9 without comorbidity) - Plan: TSH, Lipid panel  Anemia, unspecified type - Plan: CBC with Differential/Platelet, Iron, TIBC and Ferritin Panel  Doing well overall.  Mood stable with sertaline will continue on current dose when due for refill.  ADD- doing well on medication. Is only needing this a couple of days per week. Will refill when due.  Counseling on healthy diet and exercise.  Immunizations and health maintenance UTD Check labs and follow up as needed.

## 2018-02-16 LAB — CBC WITH DIFFERENTIAL/PLATELET
BASOS: 0 %
Basophils Absolute: 0 10*3/uL (ref 0.0–0.2)
EOS (ABSOLUTE): 0.2 10*3/uL (ref 0.0–0.4)
EOS: 3 %
HEMATOCRIT: 40.4 % (ref 34.0–46.6)
Hemoglobin: 13.7 g/dL (ref 11.1–15.9)
IMMATURE GRANS (ABS): 0 10*3/uL (ref 0.0–0.1)
IMMATURE GRANULOCYTES: 0 %
LYMPHS: 29 %
Lymphocytes Absolute: 1.9 10*3/uL (ref 0.7–3.1)
MCH: 30 pg (ref 26.6–33.0)
MCHC: 33.9 g/dL (ref 31.5–35.7)
MCV: 89 fL (ref 79–97)
Monocytes Absolute: 0.4 10*3/uL (ref 0.1–0.9)
Monocytes: 6 %
NEUTROS ABS: 4 10*3/uL (ref 1.4–7.0)
NEUTROS PCT: 62 %
Platelets: 252 10*3/uL (ref 150–450)
RBC: 4.56 x10E6/uL (ref 3.77–5.28)
RDW: 12.2 % — ABNORMAL LOW (ref 12.3–15.4)
WBC: 6.4 10*3/uL (ref 3.4–10.8)

## 2018-02-16 LAB — COMPREHENSIVE METABOLIC PANEL
A/G RATIO: 2.3 — AB (ref 1.2–2.2)
ALBUMIN: 4.8 g/dL (ref 3.5–5.5)
ALT: 13 IU/L (ref 0–32)
AST: 18 IU/L (ref 0–40)
Alkaline Phosphatase: 85 IU/L (ref 39–117)
BUN / CREAT RATIO: 18 (ref 9–23)
BUN: 12 mg/dL (ref 6–20)
Bilirubin Total: 0.3 mg/dL (ref 0.0–1.2)
CALCIUM: 9.3 mg/dL (ref 8.7–10.2)
CHLORIDE: 103 mmol/L (ref 96–106)
CO2: 22 mmol/L (ref 20–29)
Creatinine, Ser: 0.65 mg/dL (ref 0.57–1.00)
GFR, EST AFRICAN AMERICAN: 131 mL/min/{1.73_m2} (ref >59)
GFR, EST NON AFRICAN AMERICAN: 114 mL/min/{1.73_m2} (ref >59)
GLOBULIN, TOTAL: 2.1 g/dL (ref 1.5–4.5)
Glucose: 88 mg/dL (ref 65–99)
POTASSIUM: 4.6 mmol/L (ref 3.5–5.2)
Sodium: 140 mmol/L (ref 134–144)
TOTAL PROTEIN: 6.9 g/dL (ref 6.0–8.5)

## 2018-02-16 LAB — IRON,TIBC AND FERRITIN PANEL
Ferritin: 62 ng/mL (ref 15–150)
IRON: 54 ug/dL (ref 27–159)
Iron Saturation: 13 % — ABNORMAL LOW (ref 15–55)
TIBC: 403 ug/dL (ref 250–450)
UIBC: 349 ug/dL (ref 131–425)

## 2018-02-16 LAB — LIPID PANEL
CHOL/HDL RATIO: 4.3 ratio (ref 0.0–4.4)
Cholesterol, Total: 199 mg/dL (ref 100–199)
HDL: 46 mg/dL (ref >39)
LDL CALC: 130 mg/dL — AB (ref 0–99)
Triglycerides: 116 mg/dL (ref 0–149)
VLDL Cholesterol Cal: 23 mg/dL (ref 5–40)

## 2018-02-16 LAB — TSH: TSH: 0.848 u[IU]/mL (ref 0.450–4.500)

## 2018-02-20 ENCOUNTER — Other Ambulatory Visit: Payer: Self-pay

## 2018-02-20 MED ORDER — SERTRALINE HCL 100 MG PO TABS: 100.0000 mg | ORAL_TABLET | Freq: Every day | ORAL | 2 refills | Status: DC

## 2018-02-20 NOTE — Telephone Encounter (Signed)
Patient has been informed not to take ibuprofen . She stated she is not taking it.

## 2018-02-20 NOTE — Telephone Encounter (Signed)
[facility] pharmacy sent a refill request for the pending medication. Patient stated she needs this filled because she is about to travel for the holidays. Please advise.

## 2018-02-20 NOTE — Telephone Encounter (Signed)
Ok to refill. Have her stop taking ibuprofen with this medication if she is taking it because it can increase her risk of GI upset and bleeding.

## 2018-06-29 ENCOUNTER — Telehealth: Payer: 59 | Admitting: Family

## 2018-06-29 DIAGNOSIS — R05 Cough: Secondary | ICD-10-CM | POA: Diagnosis not present

## 2018-06-29 DIAGNOSIS — R059 Cough, unspecified: Secondary | ICD-10-CM

## 2018-06-29 MED ORDER — FLUTICASONE PROPIONATE 50 MCG/ACT NA SUSP: 2.0000 | Freq: Every day | NASAL | 0 refills | Status: DC

## 2018-06-29 MED ORDER — PROMETHAZINE-DM 6.25-15 MG/5ML PO SYRP: 5.0000 mL | ORAL_SOLUTION | Freq: Four times a day (QID) | ORAL | 0 refills | Status: DC | PRN

## 2018-06-29 NOTE — Progress Notes (Signed)
We are sorry you are not feeling well.  Here is how we plan to help!  Based on what you have shared with me, it looks like you may have a viral upper respiratory infection.  Upper respiratory infections are caused by a large number of viruses; however, rhinovirus is the most common cause.   Symptoms vary from person to person, with common symptoms including sore throat, cough, and fatigue or lack of energy.  A low-grade fever of up to 100.4 may present, but is often uncommon.  Symptoms vary however, and are closely related to a person's age or underlying illnesses.  The most common symptoms associated with an upper respiratory infection are nasal discharge or congestion, cough, sneezing, headache and pressure in the ears and face.  These symptoms usually persist for about 3 to 10 days, but can last up to 2 weeks.  It is important to know that upper respiratory infections do not cause serious illness or complications in most cases.    Upper respiratory infections can be transmitted from person to person, with the most common method of transmission being a person's hands.  The virus is able to live on the skin and can infect other persons for up to 2 hours after direct contact.  Also, these can be transmitted when someone coughs or sneezes; thus, it is important to cover the mouth to reduce this risk.  To keep the spread of the illness at bay, good hand hygiene is very important.  This is an infection that is most likely caused by a virus. There are no specific treatments other than to help you with the symptoms until the infection runs its course.  We are sorry you are not feeling well.  Here is how we plan to help!   For nasal congestion, you may use an oral decongestants such as Mucinex D or if you have glaucoma or high blood pressure use plain Mucinex.  Saline nasal spray or nasal drops can help and can safely be used as often as needed for congestion.  For your congestion, I have prescribed Fluticasone  nasal spray one spray in each nostril twice a day  If you do not have a history of heart disease, hypertension, diabetes or thyroid disease, prostate/bladder issues or glaucoma, you may also use Sudafed to treat nasal congestion.  It is highly recommended that you consult with a pharmacist or your primary care physician to ensure this medication is safe for you to take.     If you have a cough, you may use cough suppressants such as Delsym and Robitussin.  If you have glaucoma or high blood pressure, you can also use Coricidin HBP.   For cough I have prescribed for you  Promethazine DM 4 times a day as needed  If you have a sore or scratchy throat, use a saltwater gargle-  to  teaspoon of salt dissolved in a 4-ounce to 8-ounce glass of warm water.  Gargle the solution for approximately 15-30 seconds and then spit.  It is important not to swallow the solution.  You can also use throat lozenges/cough drops and Chloraseptic spray to help with throat pain or discomfort.  Warm or cold liquids can also be helpful in relieving throat pain.  For headache, pain or general discomfort, you can use Ibuprofen or Tylenol as directed.   Some authorities believe that zinc sprays or the use of Echinacea may shorten the course of your symptoms.   HOME CARE . Only take medications   as instructed by your medical team. . Be sure to drink plenty of fluids. Water is fine as well as fruit juices, sodas and electrolyte beverages. You may want to stay away from caffeine or alcohol. If you are nauseated, try taking small sips of liquids. How do you know if you are getting enough fluid? Your urine should be a pale yellow or almost colorless. . Get rest. . Taking a steamy shower or using a humidifier may help nasal congestion and ease sore throat pain. You can place a towel over your head and breathe in the steam from hot water coming from a faucet. . Using a saline nasal spray works much the same way. . Cough drops, hard  candies and sore throat lozenges may ease your cough. . Avoid close contacts especially the very young and the elderly . Cover your mouth if you cough or sneeze . Always remember to wash your hands.   GET HELP RIGHT AWAY IF: . You develop worsening fever. . If your symptoms do not improve within 10 days . You develop yellow or green discharge from your nose over 3 days. . You have coughing fits . You develop a severe head ache or visual changes. . You develop shortness of breath, difficulty breathing or start having chest pain . Your symptoms persist after you have completed your treatment plan  MAKE SURE YOU   Understand these instructions.  Will watch your condition.  Will get help right away if you are not doing well or get worse.  Your e-visit answers were reviewed by a board certified advanced clinical practitioner to complete your personal care plan. Depending upon the condition, your plan could have included both over the counter or prescription medications. Please review your pharmacy choice. If there is a problem, you may call our nursing hot line at and have the prescription routed to another pharmacy. Your safety is important to us. If you have drug allergies check your prescription carefully.   You can use MyChart to ask questions about today's visit, request a non-urgent call back, or ask for a work or school excuse for 24 hours related to this e-Visit. If it has been greater than 24 hours you will need to follow up with your provider, or enter a new e-Visit to address those concerns. You will get an e-mail in the next two days asking about your experience.  I hope that your e-visit has been valuable and will speed your recovery. Thank you for using e-visits.      

## 2018-07-01 ENCOUNTER — Other Ambulatory Visit: Payer: Self-pay | Admitting: Family Medicine

## 2018-07-02 NOTE — Telephone Encounter (Signed)
Is this ok to refill?  

## 2018-07-26 ENCOUNTER — Telehealth: Payer: Self-pay | Admitting: Family Medicine

## 2018-07-26 ENCOUNTER — Other Ambulatory Visit: Payer: Self-pay | Admitting: Family Medicine

## 2018-07-26 DIAGNOSIS — F988 Other specified behavioral and emotional disorders with onset usually occurring in childhood and adolescence: Secondary | ICD-10-CM

## 2018-07-26 MED ORDER — AMPHETAMINE-DEXTROAMPHETAMINE 20 MG PO TABS: 20.0000 mg | ORAL_TABLET | Freq: Every day | ORAL | 0 refills | Status: DC

## 2018-07-26 NOTE — Telephone Encounter (Signed)
Pt called for refills of Adderall. Please send to [facility]. Pt can be reached at [phone].

## 2018-07-26 NOTE — Telephone Encounter (Signed)
Please call and find out when her LMP was and what she is using for contraception. I will refill her Adderall but remind her this is not safe if there is a chance of pregnancy. Thanks.

## 2018-07-26 NOTE — Telephone Encounter (Signed)
Pt states she had her period a 1week ago and her husband had a vasectomy, so no chance of pregnancy. Please refill

## 2018-12-10 ENCOUNTER — Other Ambulatory Visit: Payer: Self-pay | Admitting: Family Medicine

## 2018-12-10 NOTE — Telephone Encounter (Signed)
Needs visit. Ok to give 30 days. May be virtual or in office

## 2018-12-10 NOTE — Telephone Encounter (Signed)
Pt has an appt already in november

## 2019-01-21 ENCOUNTER — Other Ambulatory Visit: Payer: Self-pay

## 2019-01-21 DIAGNOSIS — Z20822 Contact with and (suspected) exposure to covid-19: Secondary | ICD-10-CM

## 2019-01-22 LAB — NOVEL CORONAVIRUS, NAA: SARS-CoV-2, NAA: NOT DETECTED

## 2019-02-24 NOTE — Progress Notes (Deleted)
Subjective:    Patient ID: Ann Morris, female    DOB: Jan 29, 1981, 38 y.o.   MRN: [id number]  HPI No chief complaint on file.  She is new to the practice and here for a complete physical exam. Previous medical care: Last CPE:  Other providers: OB/GYN - Dr. Parke Poisson- dentist   ?Taking Adderall 2 days per week. One dose on those days. It lasts 5 hours. No side effects.  ?States her mood is fine. Taking sertraline daily and no concerns.     Social history: Lives with her husband and 2 kids.  works at [employer].  Denies smoking, drinking alcohol some days, no drug use  Diet: *** Excerise: ***  Immunizations:  Health maintenance:  Mammogram: Colonoscopy: Last Gynecological Exam: Last Menstrual cycle: Pregnancies:  Last Dental Exam: Last Eye Exam:  Wears seatbelt always, uses sunscreen, smoke detectors in home and functioning, does not text while driving and feels safe in home environment.   Reviewed allergies, medications, past medical, surgical, family, and social history.   Review of Systems Review of Systems Constitutional: -fever, -chills, -sweats, -unexpected weight change,-fatigue ENT: -runny nose, -ear pain, -sore throat Cardiology:  -chest pain, -palpitations, -edema Respiratory: -cough, -shortness of breath, -wheezing Gastroenterology: -abdominal pain, -nausea, -vomiting, -diarrhea, -constipation  Hematology: -bleeding or bruising problems Musculoskeletal: -arthralgias, -myalgias, -joint swelling, -back pain Ophthalmology: -vision changes Urology: -dysuria, -difficulty urinating, -hematuria, -urinary frequency, -urgency Neurology: -headache, -weakness, -tingling, -numbness       Objective:   Physical Exam There were no vitals taken for this visit.  General Appearance:    Alert, cooperative, no distress, appears stated age  Head:    Normocephalic, without obvious abnormality, atraumatic  Eyes:    PERRL, conjunctiva/corneas  clear, EOM's intact, fundi    benign  Ears:    Normal TM's and external ear canals  Nose:   Nares normal, mucosa normal, no drainage or sinus   tenderness  Throat:   Lips, mucosa, and tongue normal; teeth and gums normal  Neck:   Supple, no lymphadenopathy;  thyroid:  no   enlargement/tenderness/nodules; no carotid   bruit or JVD  Back:    Spine nontender, no curvature, ROM normal, no CVA     tenderness  Lungs:     Clear to auscultation bilaterally without wheezes, rales or     ronchi; respirations unlabored  Chest Wall:    No tenderness or deformity   Heart:    Regular rate and rhythm, S1 and S2 normal, no murmur, rub   or gallop  Breast Exam:    No tenderness, masses, or nipple discharge or inversion.      No axillary lymphadenopathy  Abdomen:     Soft, non-tender, nondistended, normoactive bowel sounds,    no masses, no hepatosplenomegaly  Genitalia:    Normal external genitalia without lesions.  BUS and vagina normal; cervix without lesions, or cervical motion tenderness. No abnormal vaginal discharge.  Uterus and adnexa not enlarged, nontender, no masses.  Pap performed  Rectal:    Not performed due to age<40 and no related complaints  Extremities:   No clubbing, cyanosis or edema  Pulses:   2+ and symmetric all extremities  Skin:   Skin color, texture, turgor normal, no rashes or lesions  Lymph nodes:   Cervical, supraclavicular, and axillary nodes normal  Neurologic:   CNII-XII intact, normal strength, sensation and gait; reflexes 2+ and symmetric throughout          Psych:  Normal mood, affect, hygiene and grooming.         Assessment & Plan:  Routine general medical examination at a health care facility

## 2019-02-25 ENCOUNTER — Encounter: Payer: Self-pay | Admitting: Family Medicine

## 2019-03-20 ENCOUNTER — Encounter: Payer: Self-pay | Admitting: Family Medicine

## 2019-04-09 ENCOUNTER — Ambulatory Visit: Payer: Managed Care, Other (non HMO) | Attending: Internal Medicine

## 2019-04-09 DIAGNOSIS — Z20822 Contact with and (suspected) exposure to covid-19: Secondary | ICD-10-CM

## 2019-04-11 LAB — NOVEL CORONAVIRUS, NAA: SARS-CoV-2, NAA: NOT DETECTED

## 2019-04-18 ENCOUNTER — Encounter: Payer: Self-pay | Admitting: Family Medicine

## 2019-05-22 ENCOUNTER — Encounter: Payer: Self-pay | Admitting: Family Medicine

## 2019-05-27 ENCOUNTER — Ambulatory Visit: Payer: Managed Care, Other (non HMO) | Attending: Internal Medicine

## 2019-05-27 DIAGNOSIS — Z20822 Contact with and (suspected) exposure to covid-19: Secondary | ICD-10-CM

## 2019-05-28 ENCOUNTER — Other Ambulatory Visit: Payer: Self-pay | Admitting: Family Medicine

## 2019-05-28 LAB — NOVEL CORONAVIRUS, NAA: SARS-CoV-2, NAA: DETECTED — AB

## 2019-05-28 NOTE — Telephone Encounter (Signed)
Pt has canceled 2 cpe already for this year and I see she had covid testing yesterday. Please davise

## 2019-05-28 NOTE — Telephone Encounter (Signed)
Ok to give 30 days and she will need a visit, at least virtual before any additional refills. Please make sure she is aware of this and document.

## 2019-05-28 NOTE — Telephone Encounter (Signed)
Left detailed message about pt calling back to schedule for a virtual med check if she can't come in due to exposure

## 2019-05-29 ENCOUNTER — Ambulatory Visit (HOSPITAL_COMMUNITY)
Admission: RE | Admit: 2019-05-29 | Discharge: 2019-05-29 | Disposition: A | Discharge status: Home / Self Care | Payer: Managed Care, Other (non HMO) | Source: Ambulatory Visit | Attending: Pulmonary Disease | Admitting: Pulmonary Disease

## 2019-05-29 ENCOUNTER — Other Ambulatory Visit: Payer: Self-pay | Admitting: Nurse Practitioner

## 2019-05-29 DIAGNOSIS — Z6837 Body mass index (BMI) 37.0-37.9, adult: Secondary | ICD-10-CM | POA: Insufficient documentation

## 2019-05-29 DIAGNOSIS — E6609 Other obesity due to excess calories: Secondary | ICD-10-CM

## 2019-05-29 DIAGNOSIS — U071 COVID-19: Secondary | ICD-10-CM | POA: Insufficient documentation

## 2019-05-29 MED ORDER — SODIUM CHLORIDE 0.9 % IV SOLN
INTRAVENOUS | Status: DC | PRN
  Administered 2019-05-29: 250 mL via INTRAVENOUS

## 2019-05-29 MED ORDER — EPINEPHRINE 0.3 MG/0.3ML IJ SOAJ: 0.3000 mg | Freq: Once | INTRAMUSCULAR | Status: DC | PRN

## 2019-05-29 MED ORDER — FAMOTIDINE IN NACL 20-0.9 MG/50ML-% IV SOLN: 20.0000 mg | Freq: Once | INTRAVENOUS | Status: DC | PRN

## 2019-05-29 MED ORDER — METHYLPREDNISOLONE SODIUM SUCC 125 MG IJ SOLR: 125.0000 mg | Freq: Once | INTRAMUSCULAR | Status: DC | PRN

## 2019-05-29 MED ORDER — SODIUM CHLORIDE 0.9 % IV SOLN
700.0000 mg | Freq: Once | INTRAVENOUS | Status: AC
  Administered 2019-05-29: 700 mg via INTRAVENOUS
  Filled 2019-05-29: qty 20

## 2019-05-29 MED ORDER — DIPHENHYDRAMINE HCL 50 MG/ML IJ SOLN: 50.0000 mg | Freq: Once | INTRAMUSCULAR | Status: DC | PRN

## 2019-05-29 MED ORDER — ALBUTEROL SULFATE HFA 108 (90 BASE) MCG/ACT IN AERS: 2.0000 | INHALATION_SPRAY | Freq: Once | RESPIRATORY_TRACT | Status: DC | PRN

## 2019-05-29 NOTE — Progress Notes (Signed)
  I connected by phone with Ann Morris on 05/29/2019 at 9:44 AM to discuss the potential use of an new treatment for mild to moderate COVID-19 viral infection in non-hospitalized patients.  This patient is a 39 y.o. female that meets the FDA criteria for Emergency Use Authorization of bamlanivimab or casirivimab\imdevimab.  Has a (+) direct SARS-CoV-2 viral test result  Has mild or moderate COVID-19   Is ? 40 years of age and weighs ? 40 kg  Is NOT hospitalized due to COVID-19  Is NOT requiring oxygen therapy or requiring an increase in baseline oxygen flow rate due to COVID-19  Is within 10 days of symptom onset  Has at least one of the high risk factor(s) for progression to severe COVID-19 and/or hospitalization as defined in EUA.  Specific high risk criteria : BMI >/= 35   I have spoken and communicated the following to the patient or parent/caregiver:  1. FDA has authorized the emergency use of bamlanivimab and casirivimab\imdevimab for the treatment of mild to moderate COVID-19 in adults and pediatric patients with positive results of direct SARS-CoV-2 viral testing who are 78 years of age and older weighing at least 40 kg, and who are at high risk for progressing to severe COVID-19 and/or hospitalization.  2. The significant known and potential risks and benefits of bamlanivimab and casirivimab\imdevimab, and the extent to which such potential risks and benefits are unknown.  3. Information on available alternative treatments and the risks and benefits of those alternatives, including clinical trials.  4. Patients treated with bamlanivimab and casirivimab\imdevimab should continue to self-isolate and use infection control measures (e.g., wear mask, isolate, social distance, avoid sharing personal items, clean and disinfect "high touch" surfaces, and frequent handwashing) according to CDC guidelines.   5. The patient or parent/caregiver has the option to accept or refuse  bamlanivimab or casirivimab\imdevimab .  After reviewing this information with the patient, The patient agreed to proceed with receiving the bamlanimivab infusion and will be provided a copy of the Fact sheet prior to receiving the infusion.[name] 05/29/2019 9:44 AM

## 2019-05-29 NOTE — Progress Notes (Signed)
  Diagnosis: COVID-19  Physician: Patrick Wright  Procedure: [facility] Med: bamlanivimab infusion - Provided patient with bamlanimivab fact sheet for patients, parents and caregivers prior to infusion.  Complications: No immediate complications noted.  Discharge: Discharged home   Ann Morris C 05/29/2019  

## 2019-05-29 NOTE — Discharge Instructions (Signed)

## 2019-07-02 ENCOUNTER — Telehealth: Payer: Self-pay | Admitting: Family Medicine

## 2019-07-02 MED ORDER — SERTRALINE HCL 100 MG PO TABS: ORAL_TABLET | ORAL | 0 refills | Status: DC

## 2019-07-02 NOTE — Telephone Encounter (Signed)
Pt called and schedule a CPE for 07/25/2019. She is requesting a refill on Zoloft as she will be out before then. Pt uses [facility] on [address].

## 2019-07-02 NOTE — Telephone Encounter (Signed)
done

## 2019-07-25 ENCOUNTER — Other Ambulatory Visit: Payer: Self-pay

## 2019-07-25 ENCOUNTER — Encounter: Payer: Self-pay | Admitting: Family Medicine

## 2019-07-25 ENCOUNTER — Ambulatory Visit: Payer: Managed Care, Other (non HMO) | Admitting: Family Medicine

## 2019-07-25 ENCOUNTER — Ambulatory Visit
Admission: RE | Admit: 2019-07-25 | Discharge: 2019-07-25 | Disposition: A | Discharge status: Home / Self Care | Payer: Managed Care, Other (non HMO) | Source: Ambulatory Visit | Attending: Family Medicine | Admitting: Family Medicine

## 2019-07-25 VITALS — BP 122/82 | HR 80 | Temp 97.5°F | Ht 61.25 in | Wt 202.4 lb

## 2019-07-25 DIAGNOSIS — R05 Cough: Secondary | ICD-10-CM

## 2019-07-25 DIAGNOSIS — F32A Depression, unspecified: Secondary | ICD-10-CM

## 2019-07-25 DIAGNOSIS — Z8616 Personal history of COVID-19: Secondary | ICD-10-CM

## 2019-07-25 DIAGNOSIS — Z0000 Encounter for general adult medical examination without abnormal findings: Secondary | ICD-10-CM | POA: Diagnosis not present

## 2019-07-25 DIAGNOSIS — E782 Mixed hyperlipidemia: Secondary | ICD-10-CM | POA: Diagnosis not present

## 2019-07-25 DIAGNOSIS — F329 Major depressive disorder, single episode, unspecified: Secondary | ICD-10-CM | POA: Diagnosis not present

## 2019-07-25 DIAGNOSIS — F988 Other specified behavioral and emotional disorders with onset usually occurring in childhood and adolescence: Secondary | ICD-10-CM | POA: Diagnosis not present

## 2019-07-25 DIAGNOSIS — R058 Other specified cough: Secondary | ICD-10-CM

## 2019-07-25 MED ORDER — SERTRALINE HCL 100 MG PO TABS: ORAL_TABLET | ORAL | 5 refills | Status: DC

## 2019-07-25 MED ORDER — AMPHETAMINE-DEXTROAMPHETAMINE 20 MG PO TABS: 20.0000 mg | ORAL_TABLET | Freq: Every day | ORAL | 0 refills | Status: DC

## 2019-07-25 NOTE — Patient Instructions (Signed)
Go to [facility] for your chest X ray.   You can try Mucinex DM or Robitussin DM. Hydrate with water.   I will be in touch with your lab results.    Preventive Care 72-40 Years Old, Female Preventive care refers to visits with your health care provider and lifestyle choices that can promote health and wellness. This includes:  A yearly physical exam. This may also be called an annual well check.  Regular dental visits and eye exams.  Immunizations.  Screening for certain conditions.  Healthy lifestyle choices, such as eating a healthy diet, getting regular exercise, not using drugs or products that contain nicotine and tobacco, and limiting alcohol use. What can I expect for my preventive care visit? Physical exam Your health care provider will check your:  Height and weight. This may be used to calculate body mass index (BMI), which tells if you are at a healthy weight.  Heart rate and blood pressure.  Skin for abnormal spots. Counseling Your health care provider may ask you questions about your:  Alcohol, tobacco, and drug use.  Emotional well-being.  Home and relationship well-being.  Sexual activity.  Eating habits.  Work and work Statistician.  Method of birth control.  Menstrual cycle.  Pregnancy history. What immunizations do I need?  Influenza (flu) vaccine  This is recommended every year. Tetanus, diphtheria, and pertussis (Tdap) vaccine  You may need a Td booster every 10 years. Varicella (chickenpox) vaccine  You may need this if you have not been vaccinated. Human papillomavirus (HPV) vaccine  If recommended by your health care provider, you may need three doses over 6 months. Measles, mumps, and rubella (MMR) vaccine  You may need at least one dose of MMR. You may also need a second dose. Meningococcal conjugate (MenACWY) vaccine  One dose is recommended if you are age 40-21 years and a first-year college student living in a  residence hall, or if you have one of several medical conditions. You may also need additional booster doses. Pneumococcal conjugate (PCV13) vaccine  You may need this if you have certain conditions and were not previously vaccinated. Pneumococcal polysaccharide (PPSV23) vaccine  You may need one or two doses if you smoke cigarettes or if you have certain conditions. Hepatitis A vaccine  You may need this if you have certain conditions or if you travel or work in places where you may be exposed to hepatitis A. Hepatitis B vaccine  You may need this if you have certain conditions or if you travel or work in places where you may be exposed to hepatitis B. Haemophilus influenzae type b (Hib) vaccine  You may need this if you have certain conditions. You may receive vaccines as individual doses or as more than one vaccine together in one shot (combination vaccines). Talk with your health care provider about the risks and benefits of combination vaccines. What tests do I need?  Blood tests  Lipid and cholesterol levels. These may be checked every 5 years starting at age 40.  Hepatitis C test.  Hepatitis B test. Screening  Diabetes screening. This is done by checking your blood sugar (glucose) after you have not eaten for a while (fasting).  Sexually transmitted disease (STD) testing.  BRCA-related cancer screening. This may be done if you have a family history of breast, ovarian, tubal, or peritoneal cancers.  Pelvic exam and Pap test. This may be done every 3 years starting at age 40. Starting at age 40, this may be  done every 5 years if you have a Pap test in combination with an HPV test. Talk with your health care provider about your test results, treatment options, and if necessary, the need for more tests. Follow these instructions at home: Eating and drinking   Eat a diet that includes fresh fruits and vegetables, whole grains, lean protein, and low-fat dairy.  Take vitamin  and mineral supplements as recommended by your health care provider.  Do not drink alcohol if: ? Your health care provider tells you not to drink. ? You are pregnant, may be pregnant, or are planning to become pregnant.  If you drink alcohol: ? Limit how much you have to 0-1 drink a day. ? Be aware of how much alcohol is in your drink. In the [place] one drink equals one 12 oz bottle of beer (355 mL), one 5 oz glass of wine (148 mL), or one 1 oz glass of hard liquor (44 mL). Lifestyle  Take daily care of your teeth and gums.  Stay active. Exercise for at least 30 minutes on 5 or more days each week.  Do not use any products that contain nicotine or tobacco, such as cigarettes, e-cigarettes, and chewing tobacco. If you need help quitting, ask your health care provider.  If you are sexually active, practice safe sex. Use a condom or other form of birth control (contraception) in order to prevent pregnancy and STIs (sexually transmitted infections). If you plan to become pregnant, see your health care provider for a preconception visit. What's next?  Visit your health care provider once a year for a well check visit.  Ask your health care provider how often you should have your eyes and teeth checked.  Stay up to date on all vaccines. This information is not intended to replace advice given to you by your health care provider. Make sure you discuss any questions you have with your health care provider. Document Revised: 11/23/2017 Document Reviewed: 11/23/2017 Elsevier Patient Education  2020 [employer].

## 2019-07-25 NOTE — Progress Notes (Signed)
Subjective:    Patient ID: Ann Morris, female    DOB: 10/22/1980, 39 y.o.   MRN: [id number]  HPI Chief Complaint  Patient presents with  . fasting cpe    fasting cpe, sees obgyn   She is new to the practice and here for a complete physical exam.  Other providers: Dr. Garwin Brothers- OB/GYN   Complains of a cough x 2 weeks at least. States she thinks it is improving but is not becoming productive.  States she had Covid in early March.  She lost smell and taste but states that has resolved.   Received antibody infusion.   Reports energy level and appetite are fine.   States her mood has been good. Taking sertraline.  ADHD- states she only takes Adderall once weekly on average. States is works well when she needs it and no side effects.   Social history: works as a Secondary school teacher  Diet: trying to eat healthy  Excerise: walks 30 minutes 5 days a week   Immunizations: UTD   Health maintenance:  Mammogram: never  Colonoscopy: never  Last Gynecological Exam: Last Menstrual cycle:  Periods heavy. Husband with vasectomy  Last Dental Exam: 2 weeks ago  Last Eye Exam: this week   Wears seatbelt always, uses sunscreen, smoke detectors in home and functioning, does not text while driving and feels safe in home environment.   Reviewed allergies, medications, past medical, surgical, family, and social history.    Review of Systems Review of Systems Constitutional: -fever, -chills, -sweats, -unexpected weight change,-fatigue ENT: -runny nose, -ear pain, -sore throat Cardiology:  -chest pain, -palpitations, -edema Respiratory: +cough, -shortness of breath, -wheezing Gastroenterology: -abdominal pain, -nausea, -vomiting, -diarrhea, -constipation  Hematology: -bleeding or bruising problems Musculoskeletal: -arthralgias, -myalgias, -joint swelling, -back pain Ophthalmology: -vision changes Urology: -dysuria, -difficulty urinating, -hematuria, -urinary frequency,  -urgency Neurology: -headache, -weakness, -tingling, -numbness        Objective:   Physical Exam BP 122/82   Pulse 80   Temp (!) 97.5 F (36.4 C)   Ht 5' 1.25" (1.556 m)   Wt 202 lb 6.4 oz (91.8 kg)   LMP 06/29/2019   BMI 37.93 kg/m   General Appearance:    Alert, cooperative, no distress, appears stated age  Head:    Normocephalic, without obvious abnormality, atraumatic  Eyes:    PERRL, conjunctiva/corneas clear, EOM's intact  Ears:    Normal TM's and external ear canals  Nose:   Mask in place   Throat:   Mask in place   Neck:   Supple, no lymphadenopathy;  thyroid:  no   enlargement/tenderness/nodules; no JVD  Back:    Spine nontender, no curvature, ROM normal, no CVA     tenderness  Lungs:     Clear to auscultation bilaterally without wheezes, rales or     ronchi; respirations unlabored  Chest Wall:    No tenderness or deformity   Heart:    Regular rate and rhythm, S1 and S2 normal, no murmur, rub   or gallop  Breast Exam:    OB/GYN  Abdomen:     Soft, non-tender, nondistended, normoactive bowel sounds,    no masses, no hepatosplenomegaly  Genitalia:    OB/GYN  Rectal:    Not performed due to age<40 and no related complaints  Extremities:   No clubbing, cyanosis or edema  Pulses:   2+ and symmetric all extremities  Skin:   Skin color, texture, turgor normal, no rashes or lesions  Lymph nodes:  Cervical, supraclavicular, and axillary nodes normal  Neurologic:   CNII-XII intact, normal strength, sensation and gait; reflexes 2+ and symmetric throughout          Psych:   Normal mood, affect, hygiene and grooming.         Assessment & Plan:  Routine general medical examination at a health care facility - Plan: CBC with Differential/Platelet, Comprehensive metabolic panel, TSH, T4, free, T3, Lipid panel -Here for CPE.  Reviewed preventive health care.  She sees OB/GYN.  Counseled on healthy diet and exercise.  Immunizations reviewed.  Discussed safety.  Mixed  hyperlipidemia - Plan: Lipid panel -Counseled on healthy diet and exercise.  Check lipid panel follow-up.  Depression, unspecified depression type - Plan: sertraline (ZOLOFT) 100 MG tablet -She is doing well on sertraline.  In good spirits.  Continue medication  ADD (attention deficit disorder) without hyperactivity - Plan: amphetamine-dextroamphetamine (ADDERALL) 20 MG tablet -Reports only taking her Adderall approximately once weekly.  States it works for her and no side effects.  I will refill her medication for 30 days since she is not taking it very often.  Productive cough - Plan: DG Chest 2 View -She reports a 2-week history of cough that is improving.  Covid last month but cough is new since then.  I will send her for chest x-ray.  She will continue treating her cough.  Discussed supportive care.  Follow-up if worsening  History of COVID-19 - Plan: DG Chest 2 View -States she was never very sick with Covid and her cough was not present initially, only started 2 weeks ago.  States she did receive antibody infusion

## 2019-07-26 LAB — COMPREHENSIVE METABOLIC PANEL
ALT: 21 IU/L (ref 0–32)
AST: 26 IU/L (ref 0–40)
Albumin/Globulin Ratio: 1.8 (ref 1.2–2.2)
Albumin: 4.6 g/dL (ref 3.8–4.8)
Alkaline Phosphatase: 104 IU/L (ref 39–117)
BUN/Creatinine Ratio: 22 (ref 9–23)
BUN: 13 mg/dL (ref 6–20)
Bilirubin Total: 0.4 mg/dL (ref 0.0–1.2)
CO2: 22 mmol/L (ref 20–29)
Calcium: 9.2 mg/dL (ref 8.7–10.2)
Chloride: 101 mmol/L (ref 96–106)
Creatinine, Ser: 0.58 mg/dL (ref 0.57–1.00)
GFR calc Af Amer: 134 mL/min/{1.73_m2} (ref >59)
GFR calc non Af Amer: 116 mL/min/{1.73_m2} (ref >59)
Globulin, Total: 2.5 g/dL (ref 1.5–4.5)
Glucose: 77 mg/dL (ref 65–99)
Potassium: 4.3 mmol/L (ref 3.5–5.2)
Sodium: 137 mmol/L (ref 134–144)
Total Protein: 7.1 g/dL (ref 6.0–8.5)

## 2019-07-26 LAB — CBC WITH DIFFERENTIAL/PLATELET
Basophils Absolute: 0 10*3/uL (ref 0.0–0.2)
Basos: 0 %
EOS (ABSOLUTE): 0.2 10*3/uL (ref 0.0–0.4)
Eos: 3 %
Hematocrit: 39.4 % (ref 34.0–46.6)
Hemoglobin: 13.2 g/dL (ref 11.1–15.9)
Immature Grans (Abs): 0 10*3/uL (ref 0.0–0.1)
Immature Granulocytes: 0 %
Lymphocytes Absolute: 1.8 10*3/uL (ref 0.7–3.1)
Lymphs: 24 %
MCH: 30.6 pg (ref 26.6–33.0)
MCHC: 33.5 g/dL (ref 31.5–35.7)
MCV: 91 fL (ref 79–97)
Monocytes Absolute: 0.4 10*3/uL (ref 0.1–0.9)
Monocytes: 6 %
Neutrophils Absolute: 5.2 10*3/uL (ref 1.4–7.0)
Neutrophils: 67 %
Platelets: 231 10*3/uL (ref 150–450)
RBC: 4.31 x10E6/uL (ref 3.77–5.28)
RDW: 12.1 % (ref 11.7–15.4)
WBC: 7.6 10*3/uL (ref 3.4–10.8)

## 2019-07-26 LAB — LIPID PANEL
Chol/HDL Ratio: 4.2 ratio (ref 0.0–4.4)
Cholesterol, Total: 211 mg/dL — ABNORMAL HIGH (ref 100–199)
HDL: 50 mg/dL (ref >39)
LDL Chol Calc (NIH): 136 mg/dL — ABNORMAL HIGH (ref 0–99)
Triglycerides: 142 mg/dL (ref 0–149)
VLDL Cholesterol Cal: 25 mg/dL (ref 5–40)

## 2019-07-26 LAB — T3: T3, Total: 131 ng/dL (ref 71–180)

## 2019-07-26 LAB — TSH: TSH: 1.58 u[IU]/mL (ref 0.450–4.500)

## 2019-07-26 LAB — T4, FREE: Free T4: 0.98 ng/dL (ref 0.82–1.77)

## 2019-09-03 ENCOUNTER — Encounter: Payer: Self-pay | Admitting: Family Medicine

## 2019-11-20 ENCOUNTER — Other Ambulatory Visit: Payer: No Typology Code available for payment source

## 2019-11-20 ENCOUNTER — Other Ambulatory Visit: Payer: Self-pay

## 2019-11-20 DIAGNOSIS — Z20822 Contact with and (suspected) exposure to covid-19: Secondary | ICD-10-CM

## 2019-11-22 LAB — NOVEL CORONAVIRUS, NAA: SARS-CoV-2, NAA: NOT DETECTED

## 2019-11-22 LAB — SARS-COV-2, NAA 2 DAY TAT

## 2019-12-04 LAB — RESULTS CONSOLE HPV: CHL HPV: NEGATIVE

## 2019-12-12 LAB — HM PAP SMEAR: HM Pap smear: NORMAL

## 2020-01-07 ENCOUNTER — Encounter: Payer: Self-pay | Admitting: Family Medicine

## 2020-01-07 ENCOUNTER — Other Ambulatory Visit: Payer: Self-pay | Admitting: Internal Medicine

## 2020-01-07 DIAGNOSIS — Z131 Encounter for screening for diabetes mellitus: Secondary | ICD-10-CM

## 2020-02-22 ENCOUNTER — Other Ambulatory Visit: Payer: No Typology Code available for payment source

## 2020-03-17 ENCOUNTER — Other Ambulatory Visit: Payer: Self-pay | Admitting: Obstetrics and Gynecology

## 2020-03-17 DIAGNOSIS — Z1231 Encounter for screening mammogram for malignant neoplasm of breast: Secondary | ICD-10-CM

## 2020-04-21 ENCOUNTER — Encounter: Payer: Self-pay | Admitting: Family Medicine

## 2020-05-01 ENCOUNTER — Telehealth: Payer: Managed Care, Other (non HMO) | Admitting: Family Medicine

## 2020-05-19 ENCOUNTER — Telehealth: Payer: BC Managed Care – PPO | Admitting: Family Medicine

## 2020-05-21 ENCOUNTER — Telehealth (INDEPENDENT_AMBULATORY_CARE_PROVIDER_SITE_OTHER): Payer: BC Managed Care – PPO | Admitting: Family Medicine

## 2020-05-21 DIAGNOSIS — F988 Other specified behavioral and emotional disorders with onset usually occurring in childhood and adolescence: Secondary | ICD-10-CM

## 2020-05-21 MED ORDER — AMPHETAMINE-DEXTROAMPHETAMINE 20 MG PO TABS: 20.0000 mg | ORAL_TABLET | Freq: Every day | ORAL | 0 refills | Status: DC

## 2020-05-21 NOTE — Progress Notes (Signed)
   Subjective:  Documentation for virtual audio through Caregility encounter: Difficulty using video.  The patient was located at home. 2 patient identifiers used.  The provider was located in the office. The patient did consent to this visit and is aware of possible charges through their insurance for this visit.  The other persons participating in this telemedicine service were none. Time spent on call was 9 minutes and in review of previous records >15 minutes total.  This virtual service is not related to other E/M service within previous 7 days.   Patient ID: Ann Morris, female    DOB: [birth year], 40 y.o.   MRN: [id number]  HPI Chief Complaint  Patient presents with  . discuss Adderall refill    Discuss adderall refill.   States she needs a refill of Adderall. States she takes it only some days. States the medication helps.   Takes 1/2 some days and it lasts long enough for her. No side effects.   Her husband has had a vasectomy.   Alcohol is occasionally. No drug use.   No recent illness.   Denies fever, chills, dizziness, chest pain, palpitations, shortness of breath, abdominal pain, N/V/D.     Review of Systems Pertinent positives and negatives in the history of present illness.     Objective:   Physical Exam There were no vitals taken for this visit.  Alert and oriented and in no acute distress.  Speaking in complete sentences without difficulty.  Normal speech, mood and memory.      Assessment & Plan:  ADD (attention deficit disorder) without hyperactivity - Plan: amphetamine-dextroamphetamine (ADDERALL) 20 MG tablet  This is a virtual visit to discuss a refill of Adderall.  She has not needed a refill in several months.  She is taking Adderall as needed.  Occasionally she takes 1/2 tablet instead of a whole tablet.  She is doing well on the medication and no side effects.  PDMP reviewed.  Refill sent.  She will follow-up as scheduled for her CPE in  May.

## 2020-05-25 ENCOUNTER — Encounter: Payer: Self-pay | Admitting: Family Medicine

## 2020-05-26 ENCOUNTER — Ambulatory Visit
Admission: RE | Admit: 2020-05-26 | Discharge: 2020-05-26 | Disposition: A | Discharge status: Home / Self Care | Payer: BC Managed Care – PPO | Source: Ambulatory Visit | Attending: Obstetrics and Gynecology | Admitting: Obstetrics and Gynecology

## 2020-05-26 ENCOUNTER — Other Ambulatory Visit: Payer: Self-pay

## 2020-05-26 ENCOUNTER — Telehealth: Payer: Self-pay

## 2020-05-26 DIAGNOSIS — Z1231 Encounter for screening mammogram for malignant neoplasm of breast: Secondary | ICD-10-CM

## 2020-05-26 NOTE — Telephone Encounter (Signed)
P.A. ADDERALL  

## 2020-05-28 ENCOUNTER — Other Ambulatory Visit: Payer: Self-pay | Admitting: Obstetrics and Gynecology

## 2020-05-28 DIAGNOSIS — R928 Other abnormal and inconclusive findings on diagnostic imaging of breast: Secondary | ICD-10-CM

## 2020-05-30 NOTE — Telephone Encounter (Signed)
Approved til 05/26/21, called pt left message

## 2020-06-02 ENCOUNTER — Other Ambulatory Visit: Payer: Self-pay | Admitting: Family Medicine

## 2020-06-02 DIAGNOSIS — F32A Depression, unspecified: Secondary | ICD-10-CM

## 2020-06-03 NOTE — Telephone Encounter (Signed)
Is this okay refill? 

## 2020-06-15 ENCOUNTER — Ambulatory Visit: Payer: BC Managed Care – PPO

## 2020-06-15 ENCOUNTER — Other Ambulatory Visit: Payer: Self-pay

## 2020-06-15 ENCOUNTER — Ambulatory Visit
Admission: RE | Admit: 2020-06-15 | Discharge: 2020-06-15 | Disposition: A | Discharge status: Home / Self Care | Payer: BC Managed Care – PPO | Source: Ambulatory Visit | Attending: Obstetrics and Gynecology | Admitting: Obstetrics and Gynecology

## 2020-06-15 DIAGNOSIS — R928 Other abnormal and inconclusive findings on diagnostic imaging of breast: Secondary | ICD-10-CM

## 2020-07-03 ENCOUNTER — Encounter: Payer: Self-pay | Admitting: Internal Medicine

## 2020-07-27 ENCOUNTER — Other Ambulatory Visit: Payer: Self-pay

## 2020-07-27 ENCOUNTER — Encounter: Payer: Self-pay | Admitting: Family Medicine

## 2020-07-27 ENCOUNTER — Ambulatory Visit (INDEPENDENT_AMBULATORY_CARE_PROVIDER_SITE_OTHER): Payer: BC Managed Care – PPO | Admitting: Family Medicine

## 2020-07-27 VITALS — BP 120/80 | HR 91 | Ht 61.5 in | Wt 205.0 lb

## 2020-07-27 DIAGNOSIS — Z1159 Encounter for screening for other viral diseases: Secondary | ICD-10-CM | POA: Diagnosis not present

## 2020-07-27 DIAGNOSIS — Z8371 Family history of colonic polyps: Secondary | ICD-10-CM

## 2020-07-27 DIAGNOSIS — Z83719 Family history of colon polyps, unspecified: Secondary | ICD-10-CM

## 2020-07-27 DIAGNOSIS — E669 Obesity, unspecified: Secondary | ICD-10-CM

## 2020-07-27 DIAGNOSIS — F32A Depression, unspecified: Secondary | ICD-10-CM

## 2020-07-27 DIAGNOSIS — Z800 Family history of malignant neoplasm of digestive organs: Secondary | ICD-10-CM

## 2020-07-27 DIAGNOSIS — Z1329 Encounter for screening for other suspected endocrine disorder: Secondary | ICD-10-CM

## 2020-07-27 DIAGNOSIS — E782 Mixed hyperlipidemia: Secondary | ICD-10-CM

## 2020-07-27 DIAGNOSIS — F988 Other specified behavioral and emotional disorders with onset usually occurring in childhood and adolescence: Secondary | ICD-10-CM | POA: Diagnosis not present

## 2020-07-27 DIAGNOSIS — Z0000 Encounter for general adult medical examination without abnormal findings: Secondary | ICD-10-CM | POA: Diagnosis not present

## 2020-07-27 HISTORY — DX: Family history of colon polyps, unspecified: Z83.719

## 2020-07-27 HISTORY — DX: Obesity, unspecified: E66.9

## 2020-07-27 NOTE — Patient Instructions (Signed)
Preventive Care 41-41 Years Old, Female Preventive care refers to lifestyle choices and visits with your health care provider that can promote health and wellness. This includes:  A yearly physical exam. This is also called an annual wellness visit.  Regular dental and eye exams.  Immunizations.  Screening for certain conditions.  Healthy lifestyle choices, such as: ? Eating a healthy diet. ? Getting regular exercise. ? Not using drugs or products that contain nicotine and tobacco. ? Limiting alcohol use. What can I expect for my preventive care visit? Physical exam Your health care provider will check your:  Height and weight. These may be used to calculate your BMI (body mass index). BMI is a measurement that tells if you are at a healthy weight.  Heart rate and blood pressure.  Body temperature.  Skin for abnormal spots. Counseling Your health care provider may ask you questions about your:  Past medical problems.  Family's medical history.  Alcohol, tobacco, and drug use.  Emotional well-being.  Home life and relationship well-being.  Sexual activity.  Diet, exercise, and sleep habits.  Work and work Statistician.  Access to firearms.  Method of birth control.  Menstrual cycle.  Pregnancy history. What immunizations do I need? Vaccines are usually given at various ages, according to a schedule. Your health care provider will recommend vaccines for you based on your age, medical history, and lifestyle or other factors, such as travel or where you work.   What tests do I need? Blood tests  Lipid and cholesterol levels. These may be checked every 5 years, or more often if you are over 3 years old.  Hepatitis C test.  Hepatitis B test. Screening  Lung cancer screening. You may have this screening every year starting at age 73 if you have a 30-pack-year history of smoking and currently smoke or have quit within the past 15 years.  Colorectal cancer  screening. ? All adults should have this screening starting at age 52 and continuing until age 17. ? Your health care provider may recommend screening at age 49 if you are at increased risk. ? You will have tests every 1-10 years, depending on your results and the type of screening test.  Diabetes screening. ? This is done by checking your blood sugar (glucose) after you have not eaten for a while (fasting). ? You may have this done every 1-3 years.  Mammogram. ? This may be done every 1-2 years. ? Talk with your health care provider about when you should start having regular mammograms. This may depend on whether you have a family history of breast cancer.  BRCA-related cancer screening. This may be done if you have a family history of breast, ovarian, tubal, or peritoneal cancers.  Pelvic exam and Pap test. ? This may be done every 3 years starting at age 10. ? Starting at age 11, this may be done every 5 years if you have a Pap test in combination with an HPV test. Other tests  STD (sexually transmitted disease) testing, if you are at risk.  Bone density scan. This is done to screen for osteoporosis. You may have this scan if you are at high risk for osteoporosis. Talk with your health care provider about your test results, treatment options, and if necessary, the need for more tests. Follow these instructions at home: Eating and drinking  Eat a diet that includes fresh fruits and vegetables, whole grains, lean protein, and low-fat dairy products.  Take vitamin and mineral supplements  as recommended by your health care provider.  Do not drink alcohol if: ? Your health care provider tells you not to drink. ? You are pregnant, may be pregnant, or are planning to become pregnant.  If you drink alcohol: ? Limit how much you have to 0-1 drink a day. ? Be aware of how much alcohol is in your drink. In the [place] one drink equals one 12 oz bottle of beer (355 mL), one 5 oz glass of  wine (148 mL), or one 1 oz glass of hard liquor (44 mL).   Lifestyle  Take daily care of your teeth and gums. Brush your teeth every morning and night with fluoride toothpaste. Floss one time each day.  Stay active. Exercise for at least 30 minutes 5 or more days each week.  Do not use any products that contain nicotine or tobacco, such as cigarettes, e-cigarettes, and chewing tobacco. If you need help quitting, ask your health care provider.  Do not use drugs.  If you are sexually active, practice safe sex. Use a condom or other form of protection to prevent STIs (sexually transmitted infections).  If you do not wish to become pregnant, use a form of birth control. If you plan to become pregnant, see your health care provider for a prepregnancy visit.  If told by your health care provider, take low-dose aspirin daily starting at age 50.  Find healthy ways to cope with stress, such as: ? Meditation, yoga, or listening to music. ? Journaling. ? Talking to a trusted person. ? Spending time with friends and family. Safety  Always wear your seat belt while driving or riding in a vehicle.  Do not drive: ? If you have been drinking alcohol. Do not ride with someone who has been drinking. ? When you are tired or distracted. ? While texting.  Wear a helmet and other protective equipment during sports activities.  If you have firearms in your house, make sure you follow all gun safety procedures. What's next?  Visit your health care provider once a year for an annual wellness visit.  Ask your health care provider how often you should have your eyes and teeth checked.  Stay up to date on all vaccines. This information is not intended to replace advice given to you by your health care provider. Make sure you discuss any questions you have with your health care provider. Document Revised: 12/17/2019 Document Reviewed: 11/23/2017 Elsevier Patient Education  2021 Elsevier Inc.  

## 2020-07-27 NOTE — Progress Notes (Signed)
Subjective:    Patient ID: Ann Morris, female    DOB: 10/21/1980, 40 y.o.   MRN: [id number]  HPI Chief Complaint  Patient presents with  . cpe    Fasting cpe, sees obgyn. No concerns   She is here for a complete physical exam.  Other providers: OB/GYN   Depression- taking sertraline and doing well. Is not in counseling.  Does not feel depressed   Adderall as needed. Takes it approximately 2 days per week. Does not need a refill.   Social history: Lives with husband and 2 kids, works as Investment banker, corporate at [employer]  Denies smoking or drug use. Limits wine to 1 glass per day.   Diet: not as good as she would like.  Excerise: nothing   Immunizations: UTD  Health maintenance:  Mammogram: March 2022 Colonoscopy: N/A Last Gynecological Exam: Last Menstrual cycle: 2 weeks ago  Last Dental Exam: UTD  Last Eye Exam: UTD   Wears seatbelt always, uses sunscreen, smoke detectors in home and functioning, does not text while driving and feels safe in home environment.   Reviewed allergies, medications, past medical, surgical, family, and social history.     Review of Systems Review of Systems Constitutional: -fever, -chills, -sweats, -unexpected weight change,-fatigue ENT: -runny nose, -ear pain, -sore throat Cardiology:  -chest pain, -palpitations, -edema Respiratory: -cough, -shortness of breath, -wheezing Gastroenterology: -abdominal pain, -nausea, -vomiting, -diarrhea, -constipation  Hematology: -bleeding or bruising problems Musculoskeletal: -arthralgias, -myalgias, -joint swelling, -back pain Ophthalmology: -vision changes Urology: -dysuria, -difficulty urinating, -hematuria, -urinary frequency, -urgency Neurology: -headache, -weakness, -tingling, -numbness       Objective:   Physical Exam BP 120/80   Pulse 91   Ht 5' 1.5" (1.562 m)   Wt 205 lb (93 kg)   LMP 07/13/2020   BMI 38.11 kg/m   General Appearance:    Alert, cooperative, no distress,  appears stated age  Head:    Normocephalic, without obvious abnormality, atraumatic  Eyes:    PERRL, conjunctiva/corneas clear, EOM's intact  Ears:    Normal TM's and external ear canals  Nose:  mask on   Throat:   Mask on   Neck:   Supple, no lymphadenopathy;  thyroid:  no   enlargement/tenderness/nodules; no JVD  Back:    Spine nontender, no curvature, ROM normal, no CVA     tenderness  Lungs:     Clear to auscultation bilaterally without wheezes, rales or     ronchi; respirations unlabored  Chest Wall:    No tenderness or deformity   Heart:    Regular rate and rhythm, S1 and S2 normal, no murmur, rub   or gallop  Breast Exam:    OB/GYN  Abdomen:     Soft, non-tender, nondistended, normoactive bowel sounds,    no masses, no hepatosplenomegaly  Genitalia:    OB/GYN     Extremities:   No clubbing, cyanosis or edema  Pulses:   2+ and symmetric all extremities  Skin:   Skin color, texture, turgor normal, no rashes. Hematoma to anterior lower leg (recent fall)  Lymph nodes:   Cervical, supraclavicular, and axillary nodes normal  Neurologic:   CNII-XII intact, normal strength, sensation and gait          Psych:   Normal mood, affect, hygiene and grooming.         Assessment & Plan:  Routine general medical examination at a health care facility - Plan: CBC with Differential/Platelet, Comprehensive metabolic panel, TSH, T4, free,  T3 -Preventive health care reviewed.  She sees OB/GYN.  Counseling on healthy lifestyle including diet and exercise.  Recommend regular dental and eye exams and she reports being up-to-date.  Immunizations reviewed.  Discussed safety and health promotion.  Need for hepatitis C screening test - Plan: Hepatitis C antibody -Done per screening guidelines  Mixed hyperlipidemia - Plan: Lipid panel -Recommend low-fat, low-cholesterol diet.  I also recommend increasing her physical activity.  Follow-up pending lipid panel results  ADD (attention deficit disorder)  without hyperactivity -She is managing well and takes Adderall as needed.  Depression, unspecified depression type -Denies feeling depressed.  Continue SSRI   Family history of colon cancer - Plan: Ambulatory referral to Gastroenterology -She would like a referral to GI to discuss when she should start having screening colonoscopies.  Referral made.  Family history of polyps in the colon - Plan: Ambulatory referral to Gastroenterology  Obesity (BMI 30-39.9) - Plan: TSH, T4, free, T3, Lipid panel -Recommend healthy diet and exercise for weight loss.  Screening for thyroid disorder - Plan: TSH, T4, free, T3

## 2020-07-28 LAB — CBC WITH DIFFERENTIAL/PLATELET
Basophils Absolute: 0 10*3/uL (ref 0.0–0.2)
Basos: 0 %
EOS (ABSOLUTE): 0.1 10*3/uL (ref 0.0–0.4)
Eos: 2 %
Hematocrit: 39.9 % (ref 34.0–46.6)
Hemoglobin: 13.5 g/dL (ref 11.1–15.9)
Immature Grans (Abs): 0 10*3/uL (ref 0.0–0.1)
Immature Granulocytes: 0 %
Lymphocytes Absolute: 2 10*3/uL (ref 0.7–3.1)
Lymphs: 33 %
MCH: 31 pg (ref 26.6–33.0)
MCHC: 33.8 g/dL (ref 31.5–35.7)
MCV: 92 fL (ref 79–97)
Monocytes Absolute: 0.4 10*3/uL (ref 0.1–0.9)
Monocytes: 7 %
Neutrophils Absolute: 3.5 10*3/uL (ref 1.4–7.0)
Neutrophils: 58 %
Platelets: 204 10*3/uL (ref 150–450)
RBC: 4.36 x10E6/uL (ref 3.77–5.28)
RDW: 11.7 % (ref 11.7–15.4)
WBC: 6.1 10*3/uL (ref 3.4–10.8)

## 2020-07-28 LAB — COMPREHENSIVE METABOLIC PANEL
ALT: 17 IU/L (ref 0–32)
AST: 19 IU/L (ref 0–40)
Albumin/Globulin Ratio: 2.3 — ABNORMAL HIGH (ref 1.2–2.2)
Albumin: 4.5 g/dL (ref 3.8–4.8)
Alkaline Phosphatase: 89 IU/L (ref 44–121)
BUN/Creatinine Ratio: 18 (ref 9–23)
BUN: 11 mg/dL (ref 6–24)
Bilirubin Total: <0.2 mg/dL (ref 0.0–1.2)
CO2: 24 mmol/L (ref 20–29)
Calcium: 9.5 mg/dL (ref 8.7–10.2)
Chloride: 102 mmol/L (ref 96–106)
Creatinine, Ser: 0.6 mg/dL (ref 0.57–1.00)
Globulin, Total: 2 g/dL (ref 1.5–4.5)
Glucose: 92 mg/dL (ref 65–99)
Potassium: 5 mmol/L (ref 3.5–5.2)
Sodium: 142 mmol/L (ref 134–144)
Total Protein: 6.5 g/dL (ref 6.0–8.5)
eGFR: 116 mL/min/{1.73_m2} (ref >59)

## 2020-07-28 LAB — T4, FREE: Free T4: 0.81 ng/dL — ABNORMAL LOW (ref 0.82–1.77)

## 2020-07-28 LAB — T3: T3, Total: 130 ng/dL (ref 71–180)

## 2020-07-28 LAB — LIPID PANEL
Chol/HDL Ratio: 3.2 ratio (ref 0.0–4.4)
Cholesterol, Total: 185 mg/dL (ref 100–199)
HDL: 57 mg/dL (ref >39)
LDL Chol Calc (NIH): 107 mg/dL — ABNORMAL HIGH (ref 0–99)
Triglycerides: 120 mg/dL (ref 0–149)
VLDL Cholesterol Cal: 21 mg/dL (ref 5–40)

## 2020-07-28 LAB — HEPATITIS C ANTIBODY: Hep C Virus Ab: <0.1 s/co ratio (ref 0.0–0.9)

## 2020-07-28 LAB — TSH: TSH: 2.04 u[IU]/mL (ref 0.450–4.500)

## 2020-08-03 ENCOUNTER — Encounter: Payer: Self-pay | Admitting: Family Medicine

## 2020-08-19 ENCOUNTER — Encounter: Payer: Self-pay | Admitting: Internal Medicine

## 2020-08-25 ENCOUNTER — Encounter: Payer: Self-pay | Admitting: Gastroenterology

## 2020-10-02 ENCOUNTER — Encounter: Payer: Self-pay | Admitting: Gastroenterology

## 2020-10-02 ENCOUNTER — Ambulatory Visit (INDEPENDENT_AMBULATORY_CARE_PROVIDER_SITE_OTHER): Payer: BC Managed Care – PPO | Admitting: Gastroenterology

## 2020-10-02 VITALS — BP 120/74 | HR 82 | Ht 61.5 in | Wt 203.1 lb

## 2020-10-02 DIAGNOSIS — R1030 Lower abdominal pain, unspecified: Secondary | ICD-10-CM | POA: Diagnosis not present

## 2020-10-02 DIAGNOSIS — Z8371 Family history of colonic polyps: Secondary | ICD-10-CM | POA: Diagnosis not present

## 2020-10-02 DIAGNOSIS — Z800 Family history of malignant neoplasm of digestive organs: Secondary | ICD-10-CM

## 2020-10-02 NOTE — Progress Notes (Signed)
HPI : Ann Morris is a very pleasant 40 year old female who was referred by Beather Arbour to discuss starting on cancer screening.  The patient's maternal grandmother was diagnosed with colon cancer in her 50s.  The patient's mother began early screening and was first found to have polyps at age 14.  She does not know any details of her mother's polyps (i.e., number, size, histology).  The patient denies any history of chronic GI symptoms, but when asked specifically does report having fleeting infrequent lower abdominal pain that are similar to menstrual cramps.  This pain lasts just for a minute or 2 and only occurs once every month or so.  She has normal bowel habits and denies problems with constipation, diarrhea or blood in her stool.  She also denies any problems with chronic upper GI symptoms such as heartburn, nausea/vomiting or dysphagia.  Her most recent CBC was normal.  Other than her grandmother, there is no other family history of GI malignancy.   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    Hx of gonorrhea    2006   Hyperlipidemia    Normal labor 08/12/2015   Postpartum care following vaginal delivery (5/18) 08/13/2015   Second-degree perineal laceration, with delivery 05/02/2017   Vaginal Pap smear, abnormal      Past Surgical History:  Procedure Laterality Date   COLPOSCOPY W/ BIOPSY / CURETTAGE     WISDOM TOOTH EXTRACTION     Family History  Problem Relation Age of Onset   Colon polyps Mother    Hypertension Mother    Diabetes Father    Asthma Father    Thyroid disease Sister    Hyperlipidemia Brother    Colon cancer Maternal Grandmother        dx in 42s   Heart disease Maternal Grandfather    Heart attack Paternal Grandfather    Diabetes Paternal Grandfather    Heart disease Paternal Grandfather     Esophageal cancer Neg Hx    Pancreatic cancer Neg Hx    Stomach cancer Neg Hx    Social History   Tobacco Use   Smoking status: Former    Pack years: 0.00    Types: Cigarettes    Quit date: 12/13/2014    Years since quitting: 5.8   Smokeless tobacco: Never  Vaping Use   Vaping Use: Never used  Substance Use Topics   Alcohol use: Yes    Alcohol/week: 2.0 standard drinks    Types: 2 Glasses of wine per week    Comment: 2-3  drinks per day   Drug use: No   Current Outpatient Medications  Medication Sig Dispense Refill   amphetamine-dextroamphetamine (ADDERALL) 20 MG tablet Take 1 tablet (20 mg total) by mouth daily. 30 tablet 0   Multiple Vitamin (MULTIVITAMIN) tablet Take 1 tablet by mouth daily.     sertraline (ZOLOFT) 100 MG tablet TAKE 1 TABLET(100 MG) BY MOUTH AT BEDTIME 90 tablet 1   No current facility-administered medications for this visit.   No Known Allergies   Review of Systems: All systems reviewed and negative except where noted in HPI.    No results found.  Physical Exam: BP 120/74 (BP Location: Left Arm, Patient Position: Sitting, Cuff Size: Normal)   Pulse 82   Ht 5' 1.5" (1.562 m)  Wt 203 lb 2 oz (92.1 kg)   SpO2 100%   BMI 37.76 kg/m  Constitutional: Pleasant,well-developed, Caucasian female in no acute distress. HEENT: Normocephalic and atraumatic. Conjunctivae are normal. No scleral icterus. Cardiovascular: Normal rate, regular rhythm.  Pulmonary/chest: Effort normal and breath sounds normal. No wheezing, rales or rhonchi. Abdominal: Soft, nondistended, nontender. Bowel sounds active throughout. There are no masses palpable. No hepatomegaly. Extremities: no edema Neurological: Alert and oriented to person place and time. Skin: Skin is warm and dry. No rashes noted. Psychiatric: Normal mood and affect. Behavior is normal.   ASSESSMENT AND PLAN: 40 year old female with maternal grandmother with history of early colorectal cancer in mother  with early polyps.  This history alone is not sufficient enough to warrant early screening.  However, if it is found that the patient's mother had many adenomatous polyps (>10) or advanced polyps, it would be reasonable to consider starting screening early.  The patient will reach out to her mother to get the details of her polyp history and will report back.  Based on the information we will determine whether the patient can wait till age 50 for should start screening now. The patient's reported abdominal pain is not concerning given the absence of other GI symptoms and its infrequent and fleeting nature.  I do not recommend any further evaluation for this at this time.    Henson, Vickie L, NP-C

## 2020-10-02 NOTE — Patient Instructions (Signed)
If you are age 41 or older, your body mass index should be between 23-30. Your Body mass index is 37.76 kg/m. If this is out of the aforementioned range listed, please consider follow up with your Primary Care Provider.  If you are age 83 or younger, your body mass index should be between 19-25. Your Body mass index is 37.76 kg/m. If this is out of the aformentioned range listed, please consider follow up with your Primary Care Provider.   Follow up as needed.  The [facility] providers would like to encourage you to use [facility] to communicate with providers for non-urgent requests or questions.  Due to long hold times on the telephone, sending your provider a message by [facility] may be a faster and more efficient way to get a response.  Please allow 48 business hours for a response.  Please remember that this is for non-urgent requests.   It was a pleasure to see you today!  Thank you for trusting me with your gastrointestinal care!    Scott E.Tomasa Rand, MD

## 2020-11-02 ENCOUNTER — Encounter: Payer: Self-pay | Admitting: Family Medicine

## 2020-11-03 ENCOUNTER — Telehealth (INDEPENDENT_AMBULATORY_CARE_PROVIDER_SITE_OTHER): Payer: BC Managed Care – PPO | Admitting: Family Medicine

## 2020-11-03 ENCOUNTER — Other Ambulatory Visit: Payer: Self-pay

## 2020-11-03 ENCOUNTER — Encounter: Payer: Self-pay | Admitting: Family Medicine

## 2020-11-03 VITALS — Temp 97.0°F | Wt 203.0 lb

## 2020-11-03 DIAGNOSIS — J209 Acute bronchitis, unspecified: Secondary | ICD-10-CM | POA: Diagnosis not present

## 2020-11-03 MED ORDER — AMOXICILLIN 875 MG PO TABS: 875.0000 mg | ORAL_TABLET | Freq: Two times a day (BID) | ORAL | 0 refills | Status: DC

## 2020-11-03 MED ORDER — BENZONATATE 100 MG PO CAPS: 200.0000 mg | ORAL_CAPSULE | Freq: Three times a day (TID) | ORAL | 0 refills | Status: DC | PRN

## 2020-11-03 NOTE — Telephone Encounter (Signed)
Called patient, reached voice mail [email or web address] to schedule appt for cough.

## 2020-11-03 NOTE — Progress Notes (Signed)
   Subjective:    Patient ID: Ann Morris, female    DOB: 01-Dec-1980, 40 y.o.   MRN: [id number]  HPI Documentation for virtual audio and video telecommunications through Caregility encounter: The patient was located at home. 2 patient identifiers used.  The provider was located in the office. The patient did consent to this visit and is aware of possible charges through their insurance for this visit. The other persons participating in this telemedicine service were none. Time spent on call was 5 minutes and in review of previous records >20 minutes total for counseling and coordination of care. This virtual service is not related to other E/M service within previous 7 days.  She complains of a 10-day history of started with chest congestion that after several days became a productive cough.  No fever, chills, sore throat, earache.  She has no underlying allergies and does not smoke.  She has been trying OTC meds with some success.  She has had COVID test which were negative.  She does have a previous history of COVID in May of this year.  Review of Systems     Objective:   Physical Exam Alert and in no distress but coughing noted.  Breathing pattern is normal.       Assessment & Plan:  Acute bronchitis, unspecified organism - Plan: amoxicillin (AMOXIL) 875 MG tablet Her symptoms are suggestive of bronchitis and I therefore treat her.  She will call if continued difficulty.

## 2020-11-05 ENCOUNTER — Encounter: Payer: Self-pay | Admitting: Family Medicine

## 2020-11-05 DIAGNOSIS — F988 Other specified behavioral and emotional disorders with onset usually occurring in childhood and adolescence: Secondary | ICD-10-CM

## 2020-11-05 MED ORDER — AMPHETAMINE-DEXTROAMPHETAMINE 20 MG PO TABS: 20.0000 mg | ORAL_TABLET | Freq: Every day | ORAL | 0 refills | Status: DC

## 2021-03-18 IMAGING — CR DG CHEST 2V
2 series · 2 of 2 positions shown · non-contrast
Comparison: None.

CLINICAL DATA: Cough, history of COVID in [REDACTED]

EXAM:
CHEST - 2 VIEW

[w chest pa]
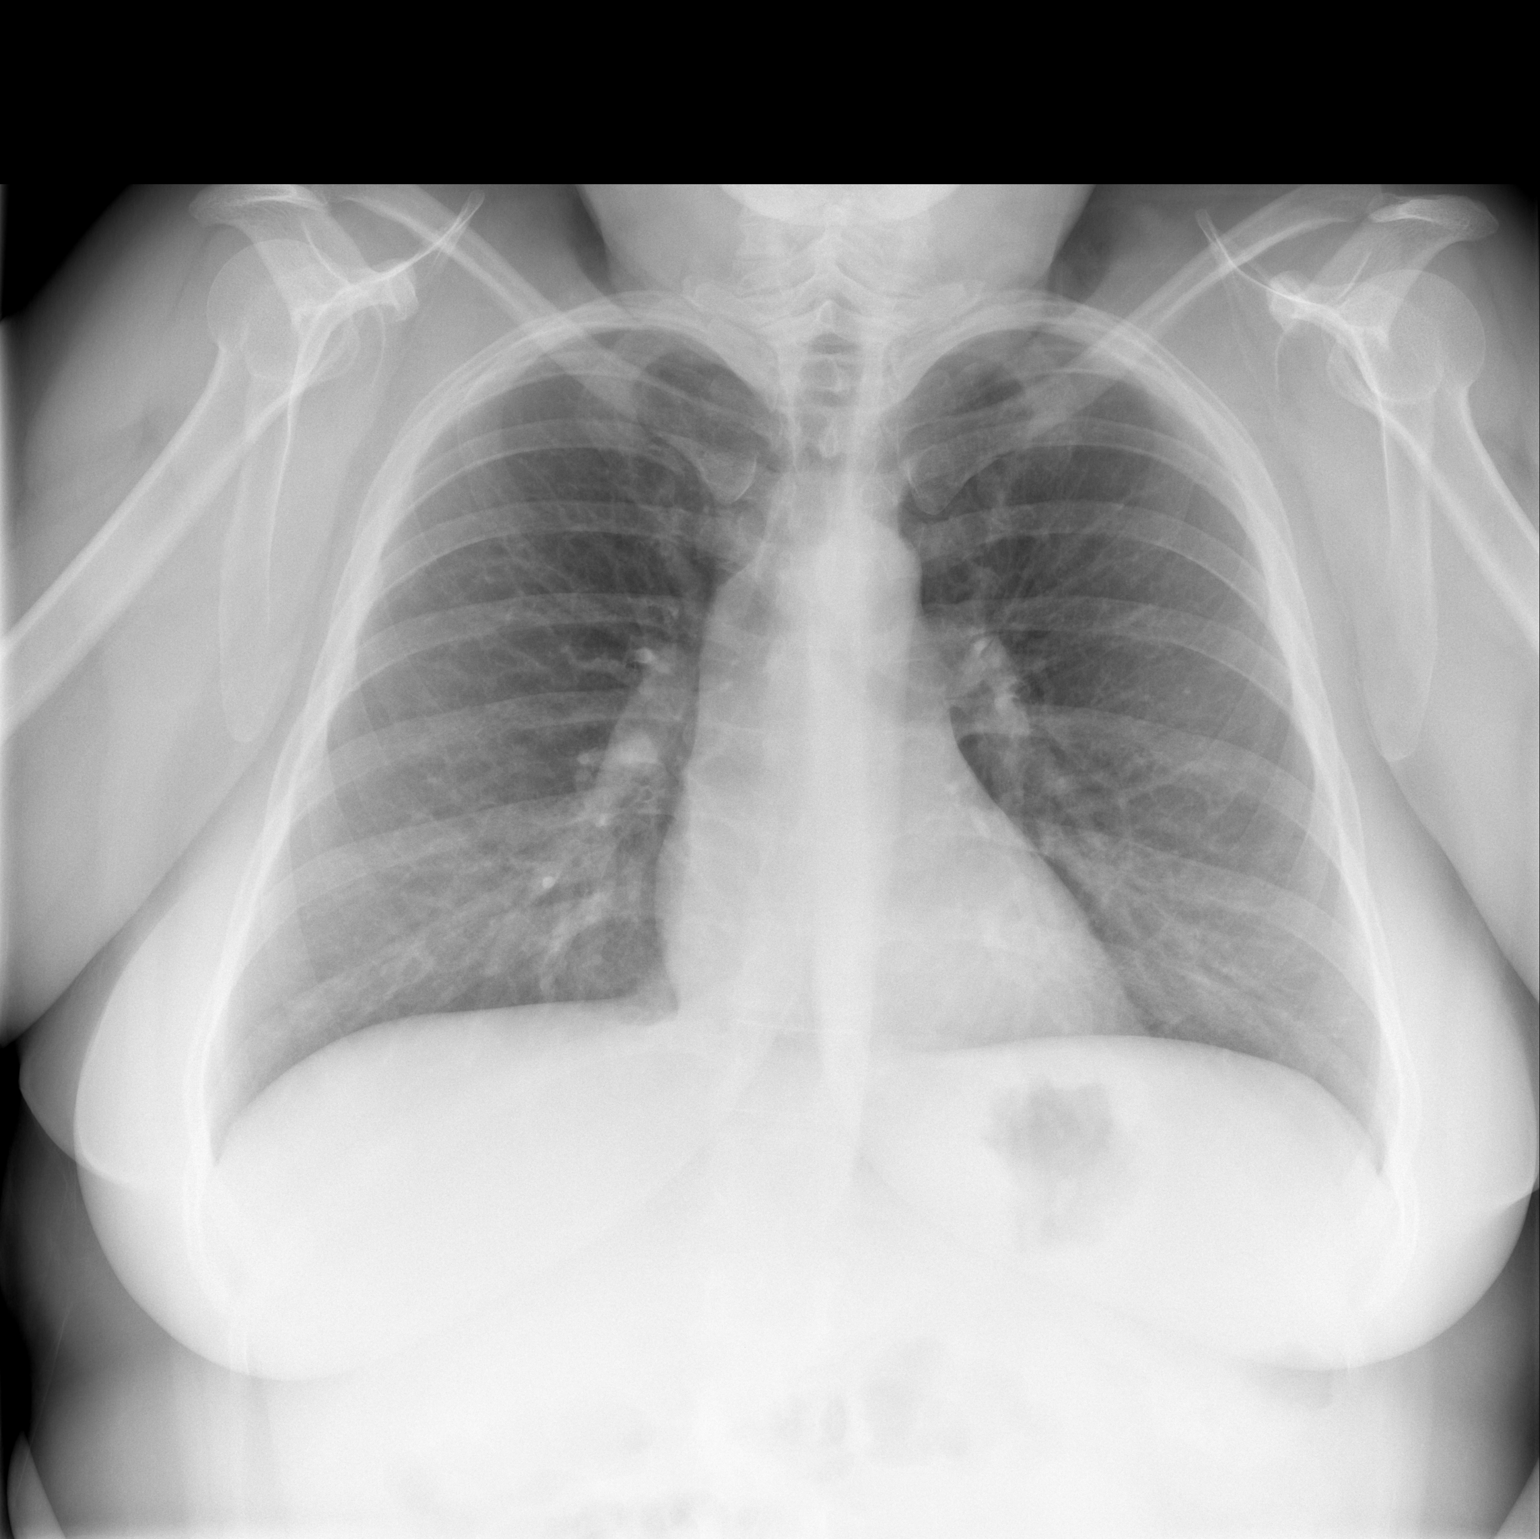

[w chest lat]
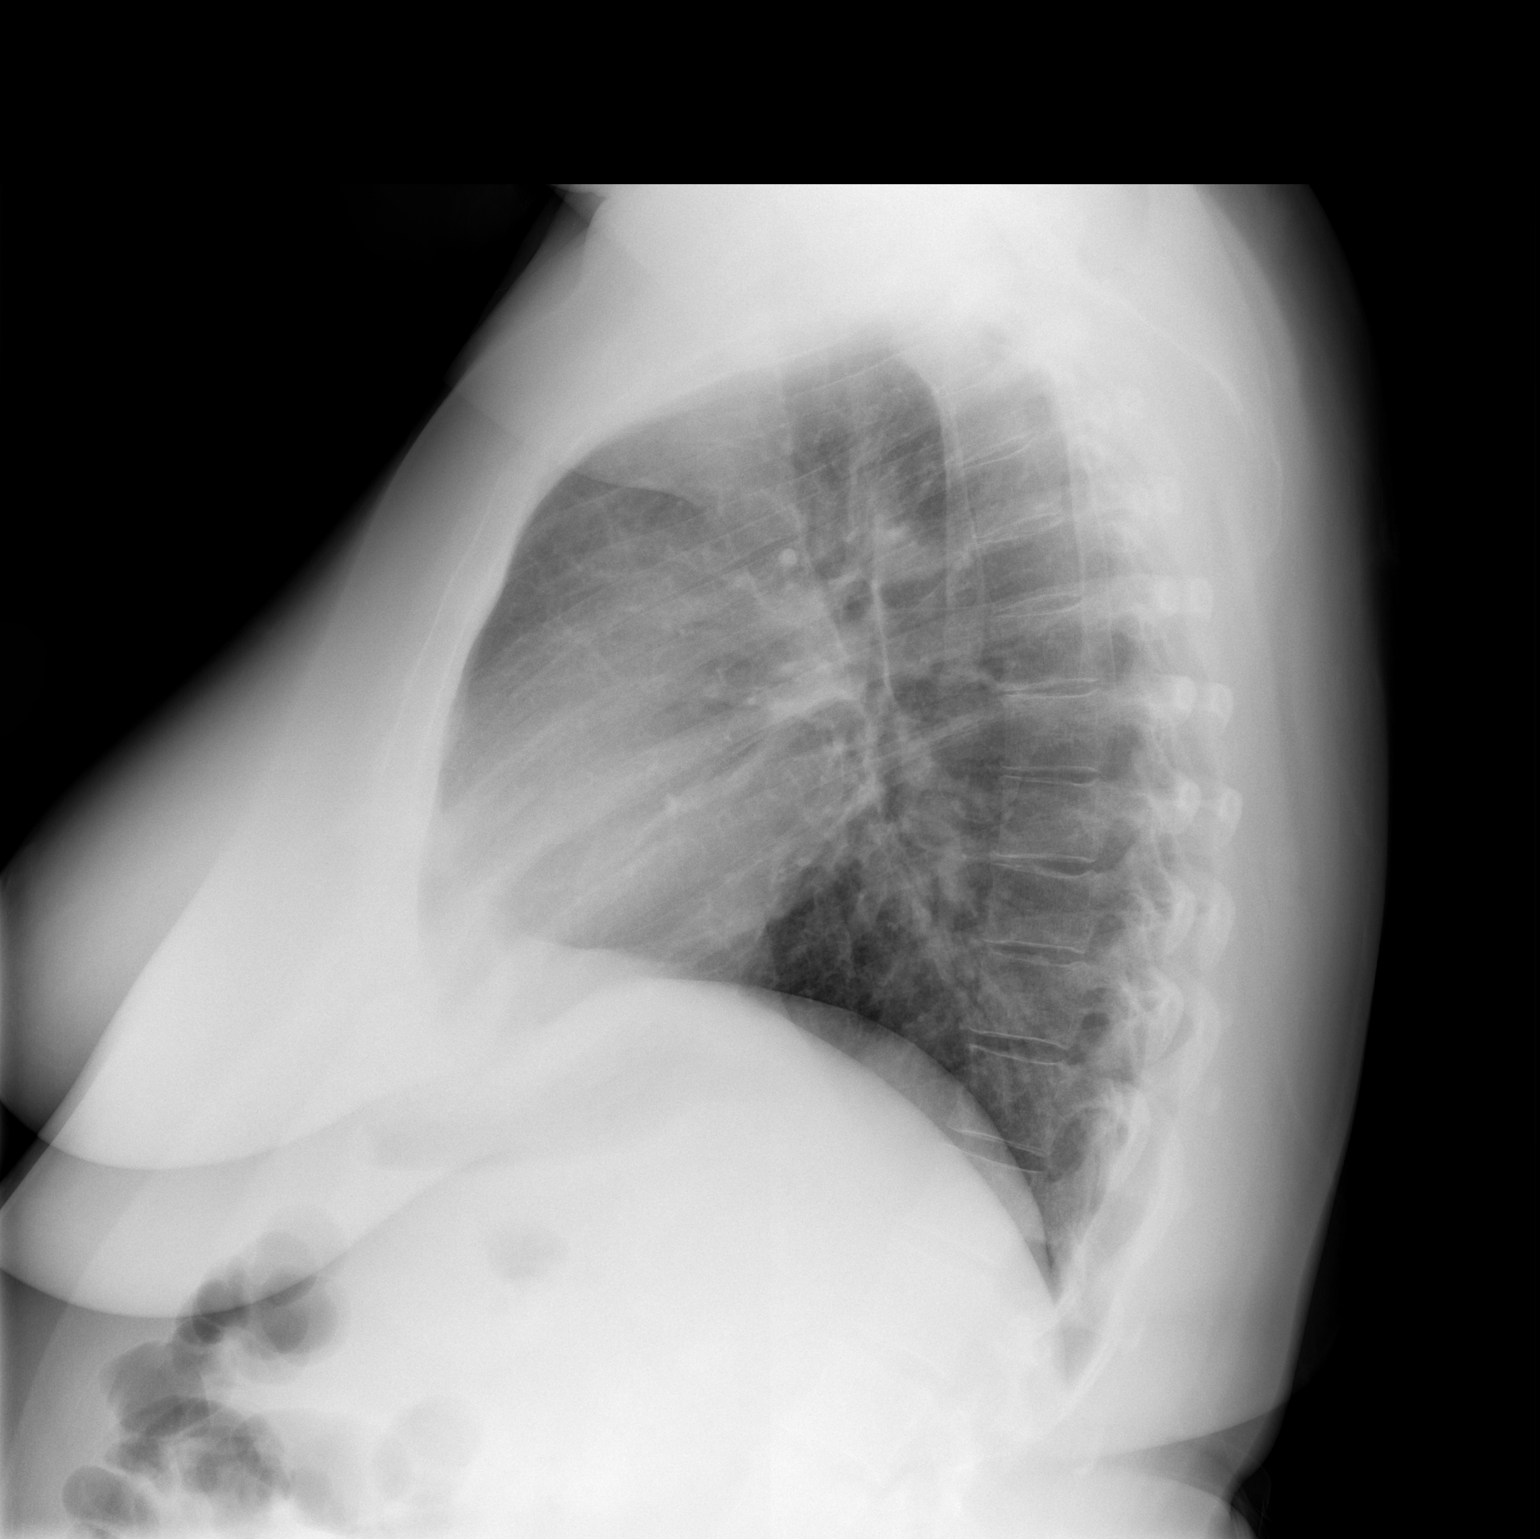

[2 of 2 positions shown; findings below may reference images not displayed]

FINDINGS: The heart size and mediastinal contours are within normal limits.
Both lungs are clear. The visualized skeletal structures are
unremarkable.
IMPRESSION: No acute abnormality of the lungs.

## 2021-06-22 ENCOUNTER — Other Ambulatory Visit: Payer: Self-pay | Admitting: Family Medicine

## 2021-06-22 DIAGNOSIS — F32A Depression, unspecified: Secondary | ICD-10-CM

## 2021-06-23 ENCOUNTER — Telehealth: Payer: Self-pay

## 2021-06-23 ENCOUNTER — Other Ambulatory Visit: Payer: Self-pay | Admitting: Family Medicine

## 2021-06-23 DIAGNOSIS — F988 Other specified behavioral and emotional disorders with onset usually occurring in childhood and adolescence: Secondary | ICD-10-CM

## 2021-06-23 NOTE — Telephone Encounter (Signed)
Is this okay to refill? 

## 2021-06-23 NOTE — Telephone Encounter (Signed)
Pt. Called stating that she needs a refill on her sertraline and Adderall to the [facility] on [address]. Last apt was 11/03/20 and next apt is 08/12/21. ?

## 2021-06-24 ENCOUNTER — Other Ambulatory Visit: Payer: Self-pay | Admitting: Obstetrics and Gynecology

## 2021-06-24 ENCOUNTER — Other Ambulatory Visit: Payer: Self-pay | Admitting: Physician Assistant

## 2021-06-24 DIAGNOSIS — Z1231 Encounter for screening mammogram for malignant neoplasm of breast: Secondary | ICD-10-CM

## 2021-06-24 DIAGNOSIS — F32A Depression, unspecified: Secondary | ICD-10-CM

## 2021-06-24 DIAGNOSIS — F988 Other specified behavioral and emotional disorders with onset usually occurring in childhood and adolescence: Secondary | ICD-10-CM

## 2021-06-24 MED ORDER — AMPHETAMINE-DEXTROAMPHETAMINE 20 MG PO TABS: 20.0000 mg | ORAL_TABLET | Freq: Every day | ORAL | 0 refills | Status: DC

## 2021-06-24 MED ORDER — SERTRALINE HCL 100 MG PO TABS: 100.0000 mg | ORAL_TABLET | Freq: Every day | ORAL | 1 refills | Status: DC

## 2021-06-24 NOTE — Telephone Encounter (Signed)
Done

## 2021-06-24 NOTE — Progress Notes (Signed)
I have logged into and reviewed this patient's PMPAware data today prior to issuing prescriptions for any controlled substances.  

## 2021-07-06 ENCOUNTER — Ambulatory Visit
Admission: RE | Admit: 2021-07-06 | Discharge: 2021-07-06 | Disposition: A | Discharge status: Home / Self Care | Payer: BC Managed Care – PPO | Source: Ambulatory Visit | Attending: Obstetrics and Gynecology | Admitting: Obstetrics and Gynecology

## 2021-07-06 DIAGNOSIS — Z1231 Encounter for screening mammogram for malignant neoplasm of breast: Secondary | ICD-10-CM

## 2021-07-07 ENCOUNTER — Other Ambulatory Visit: Payer: Self-pay | Admitting: Obstetrics and Gynecology

## 2021-07-07 DIAGNOSIS — R928 Other abnormal and inconclusive findings on diagnostic imaging of breast: Secondary | ICD-10-CM

## 2021-07-26 ENCOUNTER — Ambulatory Visit
Admission: RE | Admit: 2021-07-26 | Discharge: 2021-07-26 | Disposition: A | Discharge status: Home / Self Care | Payer: BC Managed Care – PPO | Source: Ambulatory Visit | Attending: Obstetrics and Gynecology | Admitting: Obstetrics and Gynecology

## 2021-07-26 ENCOUNTER — Other Ambulatory Visit: Payer: Self-pay | Admitting: Obstetrics and Gynecology

## 2021-07-26 DIAGNOSIS — N6489 Other specified disorders of breast: Secondary | ICD-10-CM

## 2021-07-26 DIAGNOSIS — R928 Other abnormal and inconclusive findings on diagnostic imaging of breast: Secondary | ICD-10-CM

## 2021-07-28 ENCOUNTER — Encounter: Payer: 59 | Admitting: Family Medicine

## 2021-07-29 ENCOUNTER — Encounter: Payer: BC Managed Care – PPO | Admitting: Physician Assistant

## 2021-08-12 ENCOUNTER — Ambulatory Visit (INDEPENDENT_AMBULATORY_CARE_PROVIDER_SITE_OTHER): Payer: BC Managed Care – PPO | Admitting: Physician Assistant

## 2021-08-12 ENCOUNTER — Encounter: Payer: Self-pay | Admitting: Physician Assistant

## 2021-08-12 VITALS — BP 120/70 | HR 64 | Ht 61.0 in | Wt 207.0 lb

## 2021-08-12 DIAGNOSIS — E782 Mixed hyperlipidemia: Secondary | ICD-10-CM | POA: Diagnosis not present

## 2021-08-12 DIAGNOSIS — Z0000 Encounter for general adult medical examination without abnormal findings: Secondary | ICD-10-CM

## 2021-08-12 DIAGNOSIS — Z6839 Body mass index (BMI) 39.0-39.9, adult: Secondary | ICD-10-CM

## 2021-08-12 DIAGNOSIS — F341 Dysthymic disorder: Secondary | ICD-10-CM

## 2021-08-12 DIAGNOSIS — E6609 Other obesity due to excess calories: Secondary | ICD-10-CM | POA: Diagnosis not present

## 2021-08-12 NOTE — Patient Instructions (Addendum)
Preventative Care for Adults - Female °   °  MAINTAIN REGULAR HEALTH EXAMS: °A routine yearly physical is a good way to check in with your primary care provider about your health and preventive screening. It is also an opportunity to share updates about your health and any concerns you have, and receive a thorough all-over exam.  °Most health insurance companies pay for at least some preventative services.  Check with your health plan for specific coverages. ° °WHAT PREVENTATIVE SERVICES DO WOMEN NEED? °Adult women should have their weight and blood pressure checked regularly.  °Women age 35 and older should have their cholesterol levels checked regularly. °Women should be screened for cervical cancer with a Pap smear and pelvic exam beginning at either age 21, or 3 years after they become sexually activity.   °Breast cancer screening generally begins at age 40 with a mammogram and breast exam by your primary care provider.   °Beginning at age 45 and continuing to age 75, women should be screened for colorectal cancer.  Certain people may need continued testing until age 85. °Updating vaccinations is part of preventative care.  Vaccinations help protect against diseases such as the flu. °Osteoporosis is a disease in which the bones lose minerals and strength as we age. Women ages 65 and over should discuss this with their caregivers, as should women after menopause who have other risk factors. °Lab tests are generally done as part of preventative care to screen for anemia and blood disorders, to screen for problems with the kidneys and liver, to screen for bladder problems, to check blood sugar, and to check your cholesterol level. °Preventative services generally include counseling about diet, exercise, avoiding tobacco, drugs, excessive alcohol consumption, and sexually transmitted infections.   ° °GENERAL RECOMMENDATIONS FOR GOOD HEALTH: ° °Healthy diet: °Eat a variety of foods, including fruit, vegetables,  animal or vegetable protein, such as meat, fish, chicken, and eggs, or beans, lentils, tofu, and grains, such as rice. °Drink plenty of water daily (60 - 80 ounces or 8 - 10 glasses a day) °Decrease saturated fat in the diet, avoid lots of red meat, processed foods, sweets, fast foods, and fried foods. For high cholesterol - Increase fiber intake (Benefiber or Metamucil, Cherrios,  oatmeal, beans, nuts, fruits and vegetables), limit saturated fats (in fried foods, red meat), can add OTC fish oil supplement, eat fish with Omega-3 fatty acids like salmon and tuna, exercise for 30 minutes 3 - 5 times a week, drink 8 - 10 glasses of water a day ° °Exercise: °Aerobic exercise helps maintain good heart health. At least 30-40 minutes of moderate-intensity exercise is recommended. For example, a brisk walk that increases your heart rate and breathing. This should be done on most days of the week.  °Find a type of exercise or a variety of exercises that you enjoy so that it becomes a part of your daily life.  Examples are running, walking, swimming, water aerobics, and biking.  For motivation and support, explore group exercise such as aerobic class, spin class, Zumba, Yoga,or  martial arts, etc.   °Set exercise goals for yourself, such as a certain weight goal, walk or run in a race such as a 5k walk/run.  Speak to your primary care provider about exercise goals. ° °Disease prevention: °If you smoke or chew tobacco, find out from your caregiver how to quit. It can literally save your life, no matter how long you have been a tobacco user. If you do not   use tobacco, never begin.  °Maintain a healthy diet and normal weight. Increased weight leads to problems with blood pressure and diabetes.  °The Body Mass Index or BMI is a way of measuring how much of your body is fat. Having a BMI above 27 increases the risk of heart disease, diabetes, hypertension, stroke and other problems related to obesity. Your caregiver can help  determine your BMI and based on it develop an exercise and dietary program to help you achieve or maintain this important measurement at a healthful level. °High blood pressure causes heart and blood vessel problems.  Persistent high blood pressure should be treated with medicine if weight loss and exercise do not work.  °Fat and cholesterol leaves deposits in your arteries that can block them. This causes heart disease and vessel disease elsewhere in your body.  If your cholesterol is found to be high, or if you have heart disease or certain other medical conditions, then you may need to have your cholesterol monitored frequently and be treated with medication.  °Ask if you should have a cardiac stress test if your history suggests this. A stress test is a test done on a treadmill that looks for heart disease. This test can find disease prior to there being a problem. °Menopause can be associated with physical symptoms and risks. Hormone replacement therapy is available to decrease these. You should talk to your caregiver about whether starting or continuing to take hormones is right for you.  °Osteoporosis is a disease in which the bones lose minerals and strength as we age. This can result in serious bone fractures. Risk of osteoporosis can be identified using a bone density scan. Women ages 65 and over should discuss this with their caregivers, as should women after menopause who have other risk factors. Ask your caregiver whether you should be taking a calcium supplement and Vitamin D, to reduce the rate of osteoporosis.  °Avoid drinking alcohol in excess (more than two drinks per day).  Avoid use of street drugs. Do not share needles with anyone. Ask for professional help if you need assistance or instructions on stopping the use of alcohol, cigarettes, and/or drugs. °Brush your teeth twice a day with fluoride toothpaste, and floss once a day. Good oral hygiene prevents tooth decay and gum disease. The  problems can be painful, unattractive, and can cause other health problems. Visit your dentist for a routine oral and dental check up and preventive care every 6-12 months.  °Look at your skin regularly.  Use a mirror to look at your back. Notify your caregivers of changes in moles, especially if there are changes in shapes, colors, a size larger than a pencil eraser, an irregular border, or development of new moles. ° °Safety: °Use seatbelts 100% of the time, whether driving or as a passenger.  Use safety devices such as hearing protection if you work in environments with loud noise or significant background noise.  Use safety glasses when doing any work that could send debris in to the eyes.  Use a helmet if you ride a bike or motorcycle.  Use appropriate safety gear for contact sports.  Talk to your caregiver about gun safety. °Use sunscreen with a SPF (or skin protection factor) of 15 or greater.  Lighter skinned people are at a greater risk of skin cancer. Don’t forget to also wear sunglasses in order to protect your eyes from too much damaging sunlight. Damaging sunlight can accelerate cataract formation.  °Practice safe sex. Use   condoms. Condoms are used for birth control and to help reduce the spread of sexually transmitted infections (or STIs).  Some of the STIs are gonorrhea (the clap), chlamydia, syphilis, trichomonas, herpes, HPV (human papilloma virus) and HIV (human immunodeficiency virus) which causes AIDS. The herpes, HIV and HPV are viral illnesses that have no cure. These can result in disability, cancer and death.  °Keep carbon monoxide and smoke detectors in your home functioning at all times. Change the batteries every 6 months or use a model that plugs into the wall.  ° °Vaccinations: °Stay up to date with your tetanus shots and other required immunizations. You should have a booster for tetanus every 10 years. Be sure to get your flu shot every year, since 5%-20% of the [place] population comes  down with the flu. The flu vaccine changes each year, so being vaccinated once is not enough. Get your shot in the fall, before the flu season peaks.   °Other vaccines to consider: °Human Papilloma Virus or HPV causes cancer of the cervix, and other infections that can be transmitted from person to person. There is a vaccine for HPV, and females should get immunized between the ages of 11 and 26. It requires a series of 3 shots.  °Pneumococcal vaccine to protect against certain types of pneumonia.  This is normally recommended for adults age 65 or older.  However, adults younger than 42 years old with certain underlying conditions such as diabetes, heart or lung disease should also receive the vaccine. °Shingles vaccine to protect against Varicella Zoster if you are older than age 60, or younger than 42 years old with certain underlying illness. °Hepatitis A vaccine to protect against a form of infection of the liver by a virus acquired from food. °Hepatitis B vaccine to protect against a form of infection of the liver by a virus acquired from blood or body fluids, particularly if you work in health care. °If you plan to travel internationally, check with your local health department for specific vaccination recommendations. ° °Cancer Screening: °Breast cancer screening is essential to preventive care for women. All women age 20 and older should perform a breast self-exam every month. At age 40 and older, women should have their caregiver complete a breast exam each year. Women at ages 40 and older should have a mammogram (x-ray film) of the breasts. Your caregiver can discuss how often you need mammograms.   °Cervical cancer screening includes taking a Pap smear (sample of cells examined under a microscope) from the cervix (end of the uterus). It also includes testing for HPV (Human Papilloma Virus, which can cause cervical cancer). Screening and a pelvic exam should begin at age 21, or 3 years after a woman  becomes sexually active. Screening should occur every year, with a Pap smear but no HPV testing, up to age 30. After age 30, you should have a Pap smear every 3 years with HPV testing, if no HPV was found previously.  °Most routine colon cancer screening begins at the age of 50. On a yearly basis, doctors may provide special easy to use take-home tests to check for hidden blood in the stool. Sigmoidoscopy or colonoscopy can detect the earliest forms of colon cancer and is life saving. These tests use a small camera at the end of a tube to directly examine the colon. Speak to your caregiver about this at age 50, when routine screening begins (and is repeated every 5 years unless early forms of pre-cancerous polyps   or small growths are found).  ° °For high cholesterol - Increase fiber intake (Benefiber or Metamucil, Cherrios,  oatmeal, beans, nuts, fruits and vegetables), limit saturated fats (in fried foods, red meat), can add OTC fish oil supplement, eat fish with Omega-3 fatty acids like salmon and tuna, exercise for 30 minutes 3 - 5 times a week, drink 8 - 10 glasses of water a day ° °  °

## 2021-08-12 NOTE — Progress Notes (Signed)
Complete physical exam  Patient: Ann Morris   DOB: [birth year]   41 y.o. Female  MRN: [id number]  Subjective:    Chief Complaint  Patient presents with   Annual Exam    Fasting CPE. Paps done at [facility]. No other concerns.    Ann Morris is a 41 y.o. female who presents today for a complete physical exam. Reports is generally feeling well; is eating a healthy diet; is sleeping well 8; drinks 32 ounces bottles of water a day; is exercising 1 weekly walking  Most recent fall risk assessment:    08/12/2021    8:57 AM  Fall Risk   Falls in the past year? 0  Number falls in past yr: 0  Injury with Fall? 0  Risk for fall due to : No Fall Risks  Follow up Falls evaluation completed     Most recent depression screenings:    08/12/2021    8:57 AM 07/27/2020    8:36 AM  PHQ 2/9 Scores  PHQ - 2 Score 0 0  PHQ- 9 Score  0        Patient Care Team: Marcellina Millin as PCP - General (Physician Assistant)   Outpatient Medications Prior to Visit  Medication Sig   amphetamine-dextroamphetamine (ADDERALL) 20 MG tablet Take 1 tablet (20 mg total) by mouth daily.   Multiple Vitamin (MULTIVITAMIN) tablet Take 1 tablet by mouth daily.   sertraline (ZOLOFT) 100 MG tablet Take 1 tablet (100 mg total) by mouth daily.   [DISCONTINUED] benzonatate (TESSALON) 100 MG capsule Take 2 capsules (200 mg total) by mouth 3 (three) times daily as needed for cough. (Patient not taking: Reported on 08/12/2021)   No facility-administered medications prior to visit.    Review of Systems  Constitutional:  Negative for fever.  HENT:  Negative for congestion.   Eyes:  Negative for blurred vision.  Respiratory:  Negative for cough.   Cardiovascular:  Negative for leg swelling.  Gastrointestinal:  Negative for constipation, diarrhea, nausea and vomiting.  Genitourinary:  Negative for dysuria.  Skin:  Negative for rash.         Objective:     BP 120/70   Pulse 64   Ht 5'  1" (1.549 m)   Wt 207 lb (93.9 kg)   LMP 08/06/2021   SpO2 99%   BMI 39.11 kg/m  BP Readings from Last 3 Encounters:  08/12/21 120/70  10/02/20 120/74  07/27/20 120/80   Wt Readings from Last 3 Encounters:  08/12/21 207 lb (93.9 kg)  11/03/20 203 lb (92.1 kg)  10/02/20 203 lb 2 oz (92.1 kg)      Physical Exam Vitals and nursing note reviewed.  Constitutional:      General: She is not in acute distress.    Appearance: Normal appearance. She is not ill-appearing.  HENT:     Head: Normocephalic and atraumatic.     Right Ear: Tympanic membrane, ear canal and external ear normal.     Left Ear: Tympanic membrane, ear canal and external ear normal.     Nose: No congestion.  Eyes:     Extraocular Movements: Extraocular movements intact.     Conjunctiva/sclera: Conjunctivae normal.     Pupils: Pupils are equal, round, and reactive to light.  Neck:     Vascular: No carotid bruit.  Cardiovascular:     Rate and Rhythm: Normal rate and regular rhythm.     Pulses: Normal pulses.     Heart  sounds: Normal heart sounds.  Pulmonary:     Effort: Pulmonary effort is normal.     Breath sounds: Normal breath sounds. No wheezing.  Abdominal:     General: Bowel sounds are normal.     Palpations: Abdomen is soft.  Musculoskeletal:        General: Normal range of motion.     Cervical back: Normal range of motion and neck supple.     Right lower leg: No edema.     Left lower leg: No edema.  Skin:    General: Skin is warm and dry.     Findings: No bruising.  Neurological:     General: No focal deficit present.     Mental Status: She is alert and oriented to person, place, and time.  Psychiatric:        Mood and Affect: Mood normal.        Behavior: Behavior normal.        Thought Content: Thought content normal.     No results found for any visits on 08/12/21. Last CBC Lab Results  Component Value Date   WBC 6.1 07/27/2020   HGB 13.5 07/27/2020   HCT 39.9 07/27/2020   MCV 92  07/27/2020   MCH 31.0 07/27/2020   RDW 11.7 07/27/2020   PLT 204 57/26/2035   Last metabolic panel Lab Results  Component Value Date   GLUCOSE 92 07/27/2020   NA 142 07/27/2020   K 5.0 07/27/2020   CL 102 07/27/2020   CO2 24 07/27/2020   BUN 11 07/27/2020   CREATININE 0.60 07/27/2020   EGFR 116 07/27/2020   CALCIUM 9.5 07/27/2020   PROT 6.5 07/27/2020   ALBUMIN 4.5 07/27/2020   LABGLOB 2.0 07/27/2020   AGRATIO 2.3 (H) 07/27/2020   BILITOT <0.2 07/27/2020   ALKPHOS 89 07/27/2020   AST 19 07/27/2020   ALT 17 07/27/2020   ANIONGAP 8 05/02/2017   Last lipids Lab Results  Component Value Date   CHOL 185 07/27/2020   HDL 57 07/27/2020   LDLCALC 107 (H) 07/27/2020   TRIG 120 07/27/2020   CHOLHDL 3.2 07/27/2020   Last hemoglobin A1c No results found for: HGBA1C Last thyroid functions Lab Results  Component Value Date   TSH 2.040 07/27/2020   T3TOTAL 130 07/27/2020   Last vitamin D No results found for: 25OHVITD2, 25OHVITD3, VD25OH Last vitamin B12 and Folate No results found for: VITAMINB12, FOLATE      Assessment & Plan:    Routine Health Maintenance and Physical Exam  Immunization History  Administered Date(s) Administered   Influenza Inj Mdck Quad Pf 01/01/2018   Influenza,inj,Quad PF,6+ Mos 02/01/2016, 12/01/2018   Influenza-Unspecified 01/01/2018   Moderna Sars-Covid-2 Vaccination 09/06/2019, 10/04/2019   PFIZER(Purple Top)SARS-COV-2 Vaccination 04/01/2020   Pfizer Covid-19 Vaccine Bivalent Booster 61yr & up 12/10/2020   Tdap 06/15/2015    Health Maintenance  Topic Date Due   INFLUENZA VACCINE  10/26/2021   PAP SMEAR-Modifier  12/12/2022   TETANUS/TDAP  06/14/2025   COVID-19 Vaccine  Completed   Hepatitis C Screening  Completed   HIV Screening  Completed   HPV VACCINES  Aged Out    Discussed health benefits of physical activity, and encouraged her to engage in regular exercise appropriate for her age and condition.  Problem List Items  Addressed This Visit       Other   Mixed hyperlipidemia    Lipid panel drawn today, eat a low fat diet, increase fiber intake (Benefiber or Metamucil,  Cherrios,  oatmeal, beans, nuts, fruits and vegetables), limit saturated fats (in fried foods, red meat), can add OTC fish oil supplement, eat fish with Omega-3 fatty acids like salmon and tuna, exercise for 30 minutes 3 - 5 times a week, drink 8 - 10 glasses of water a day        Relevant Orders   Lipid panel   Dysthymia    Stable, continue Zoloft 100 mg qd       Class 2 obesity due to excess calories without serious comorbidity with body mass index (BMI) of 39.0 to 39.9 in adult    Stable, drink 8 - 10 glasses of water daily, limit sugar intake and eat a low fat diet, exercise 3 - 5 days a week, example walking 1 - 2 miles         Other Visit Diagnoses     Routine medical exam    -  Primary   Relevant Orders   CBC with Differential/Platelet   Comprehensive metabolic panel   Lipid panel      Return in about 1 year (around 08/13/2022) for Return for Annual Exam with PCP Jimmye Norman.     Irene Pap, PA-C

## 2021-08-12 NOTE — Assessment & Plan Note (Signed)
Stable, drink 8 - 10 glasses of water daily, limit sugar intake and eat a low fat diet, exercise 3 - 5 days a week, example walking 1 - 2 miles  ? ?

## 2021-08-12 NOTE — Assessment & Plan Note (Signed)
Lipid panel drawn today,  eat a low fat diet, increase fiber intake (Benefiber or Metamucil, Cherrios,  oatmeal, beans, nuts, fruits and vegetables), limit saturated fats (in fried foods, red meat), can add OTC fish oil supplement, eat fish with Omega-3 fatty acids like salmon and tuna, exercise for 30 minutes 3 - 5 times a week, drink 8 - 10 glasses of water a day ? ? ?

## 2021-08-12 NOTE — Assessment & Plan Note (Signed)
Stable, continue Zoloft 100 mg qd

## 2021-08-13 LAB — CBC WITH DIFFERENTIAL/PLATELET
Basophils Absolute: 0 10*3/uL (ref 0.0–0.2)
Basos: 0 %
EOS (ABSOLUTE): 0.2 10*3/uL (ref 0.0–0.4)
Eos: 4 %
Hematocrit: 40.3 % (ref 34.0–46.6)
Hemoglobin: 13.7 g/dL (ref 11.1–15.9)
Immature Grans (Abs): 0 10*3/uL (ref 0.0–0.1)
Immature Granulocytes: 0 %
Lymphocytes Absolute: 1.8 10*3/uL (ref 0.7–3.1)
Lymphs: 34 %
MCH: 30.9 pg (ref 26.6–33.0)
MCHC: 34 g/dL (ref 31.5–35.7)
MCV: 91 fL (ref 79–97)
Monocytes Absolute: 0.4 10*3/uL (ref 0.1–0.9)
Monocytes: 7 %
Neutrophils Absolute: 2.8 10*3/uL (ref 1.4–7.0)
Neutrophils: 55 %
Platelets: 226 10*3/uL (ref 150–450)
RBC: 4.44 x10E6/uL (ref 3.77–5.28)
RDW: 11.9 % (ref 11.7–15.4)
WBC: 5.2 10*3/uL (ref 3.4–10.8)

## 2021-08-13 LAB — LIPID PANEL
Chol/HDL Ratio: 4.1 ratio (ref 0.0–4.4)
Cholesterol, Total: 218 mg/dL — ABNORMAL HIGH (ref 100–199)
HDL: 53 mg/dL (ref >39)
LDL Chol Calc (NIH): 143 mg/dL — ABNORMAL HIGH (ref 0–99)
Triglycerides: 121 mg/dL (ref 0–149)
VLDL Cholesterol Cal: 22 mg/dL (ref 5–40)

## 2021-08-13 LAB — COMPREHENSIVE METABOLIC PANEL
ALT: 20 IU/L (ref 0–32)
AST: 23 IU/L (ref 0–40)
Albumin/Globulin Ratio: 1.8 (ref 1.2–2.2)
Albumin: 4.5 g/dL (ref 3.8–4.8)
Alkaline Phosphatase: 89 IU/L (ref 44–121)
BUN/Creatinine Ratio: 17 (ref 9–23)
BUN: 12 mg/dL (ref 6–24)
Bilirubin Total: <0.2 mg/dL (ref 0.0–1.2)
CO2: 24 mmol/L (ref 20–29)
Calcium: 9.1 mg/dL (ref 8.7–10.2)
Chloride: 101 mmol/L (ref 96–106)
Creatinine, Ser: 0.69 mg/dL (ref 0.57–1.00)
Globulin, Total: 2.5 g/dL (ref 1.5–4.5)
Glucose: 89 mg/dL (ref 70–99)
Potassium: 4.8 mmol/L (ref 3.5–5.2)
Sodium: 138 mmol/L (ref 134–144)
Total Protein: 7 g/dL (ref 6.0–8.5)
eGFR: 112 mL/min/{1.73_m2} (ref >59)

## 2021-09-24 ENCOUNTER — Encounter: Payer: Self-pay | Admitting: Internal Medicine

## 2021-12-01 ENCOUNTER — Encounter: Payer: Self-pay | Admitting: Internal Medicine

## 2022-01-18 IMAGING — MG MM DIGITAL SCREENING BILAT W/ TOMO AND CAD
8 series · 8 of 24 positions shown · non-contrast
Comparison: None.

CLINICAL DATA: Screening.

EXAM:
DIGITAL SCREENING BILATERAL MAMMOGRAM WITH TOMOSYNTHESIS AND CAD
TECHNIQUE: Bilateral screening digital craniocaudal and mediolateral oblique
mammograms were obtained. Bilateral screening digital breast
tomosynthesis was performed. The images were evaluated with
computer-aided detection.

[L MLO synth-2D]
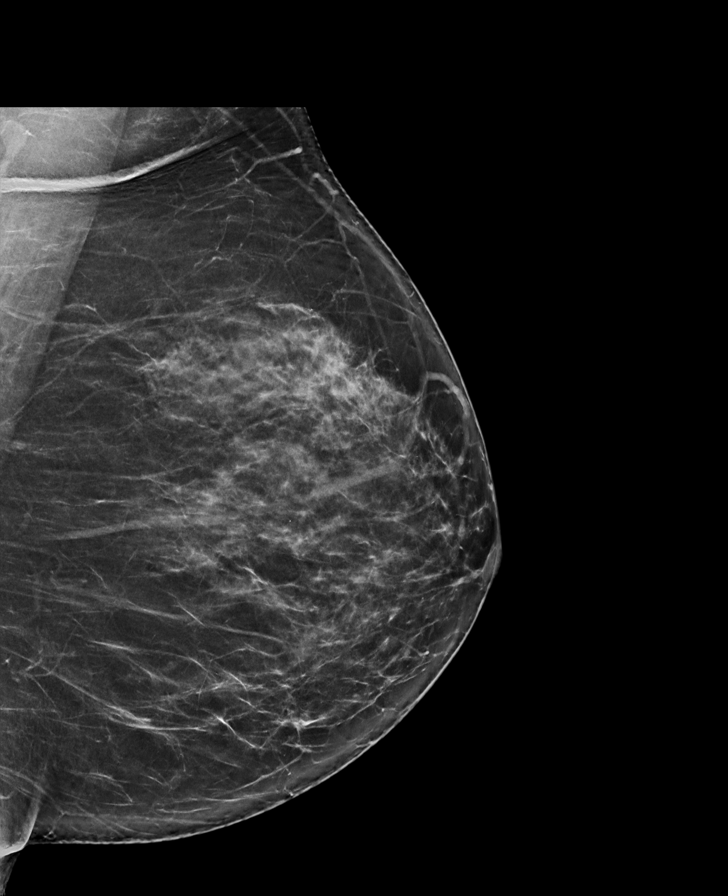

[L CC synth-2D]
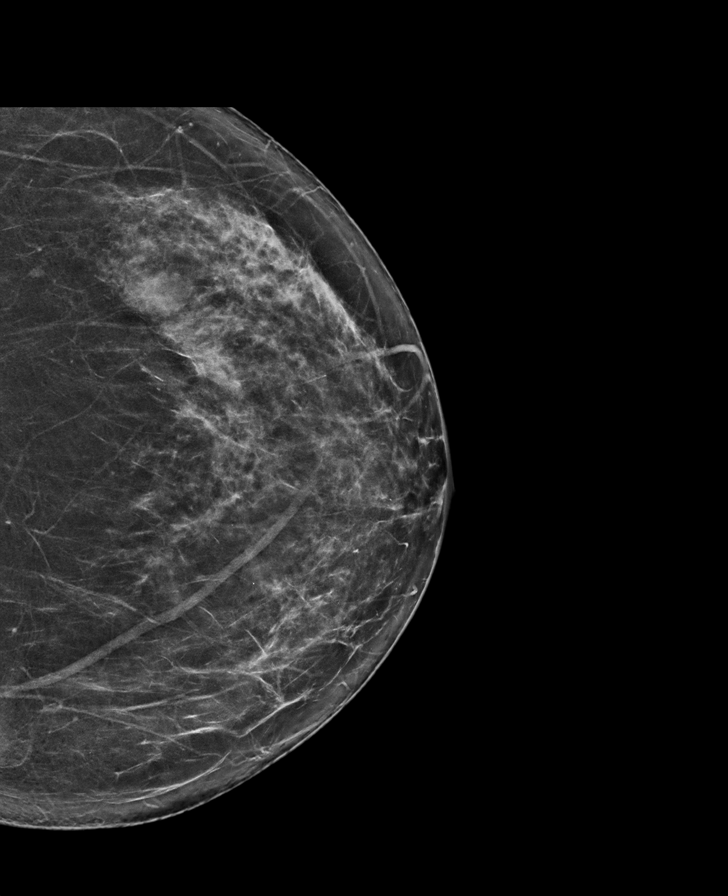

[R CC synth-2D]
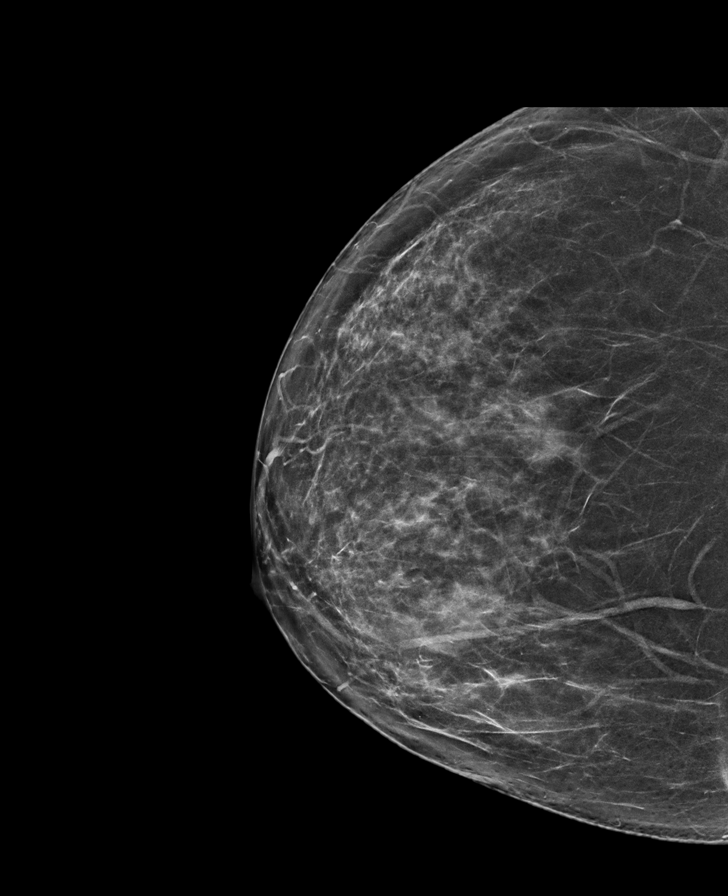

[R MLO synth-2D]
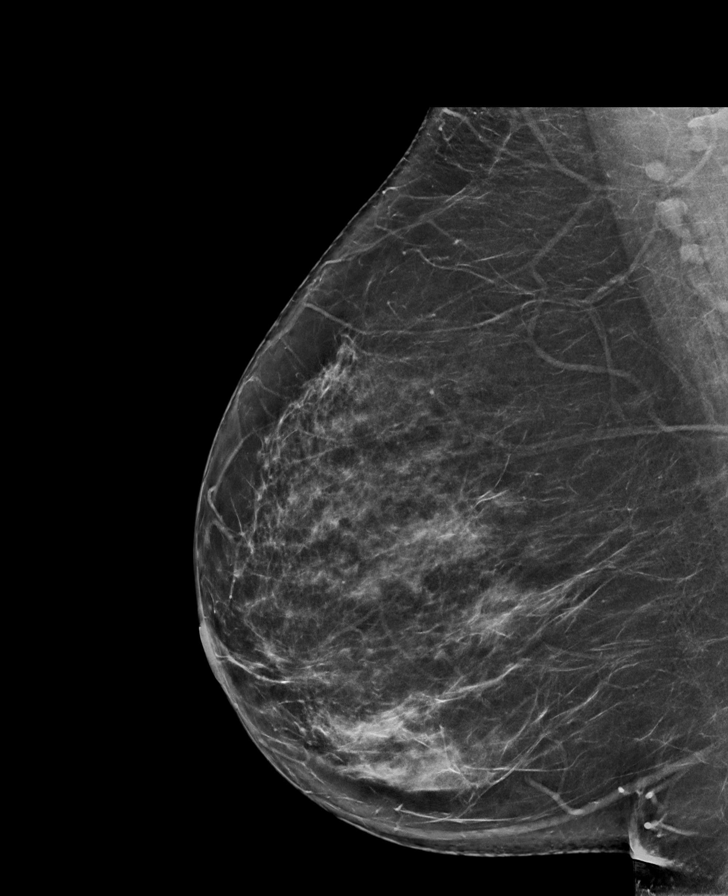

[L MLO tomo · tomo slice 43/84.0]
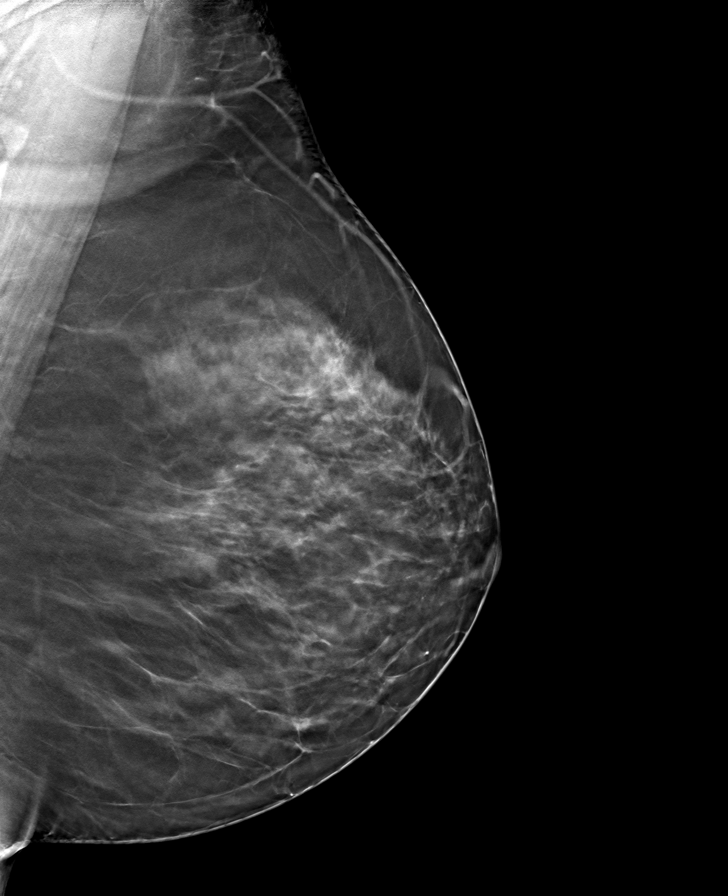

[R CC tomo · tomo slice 41/81.0]
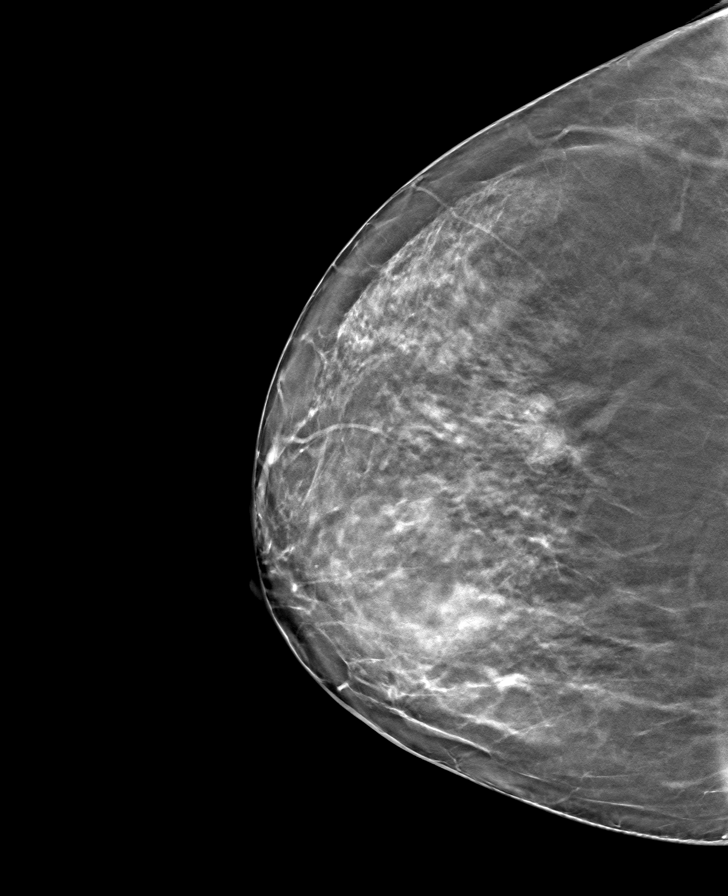

[L CC tomo · tomo slice 40/79.0]
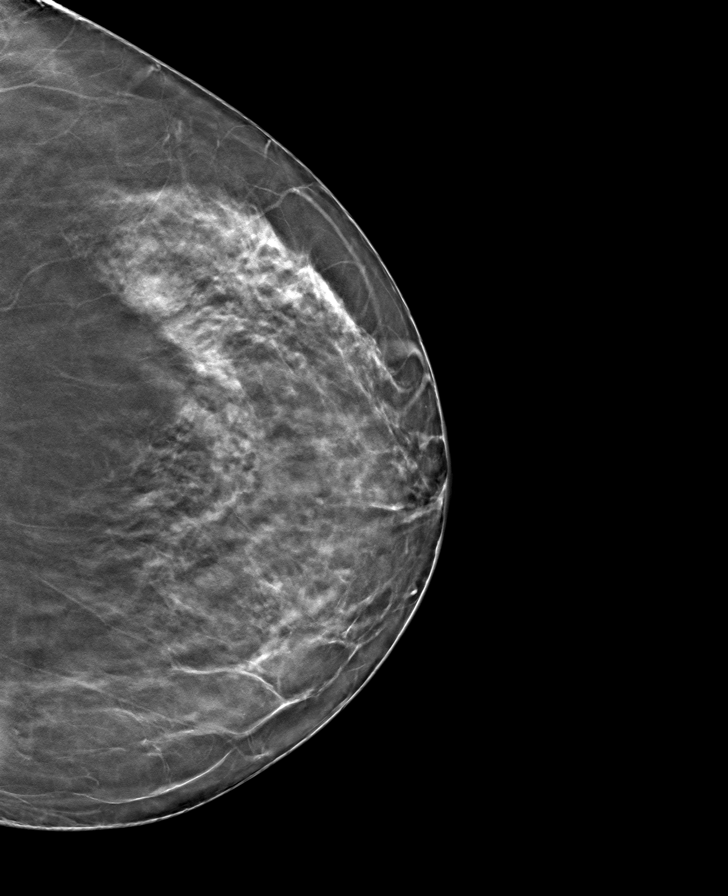

[R MLO tomo · tomo slice 43/85.0]
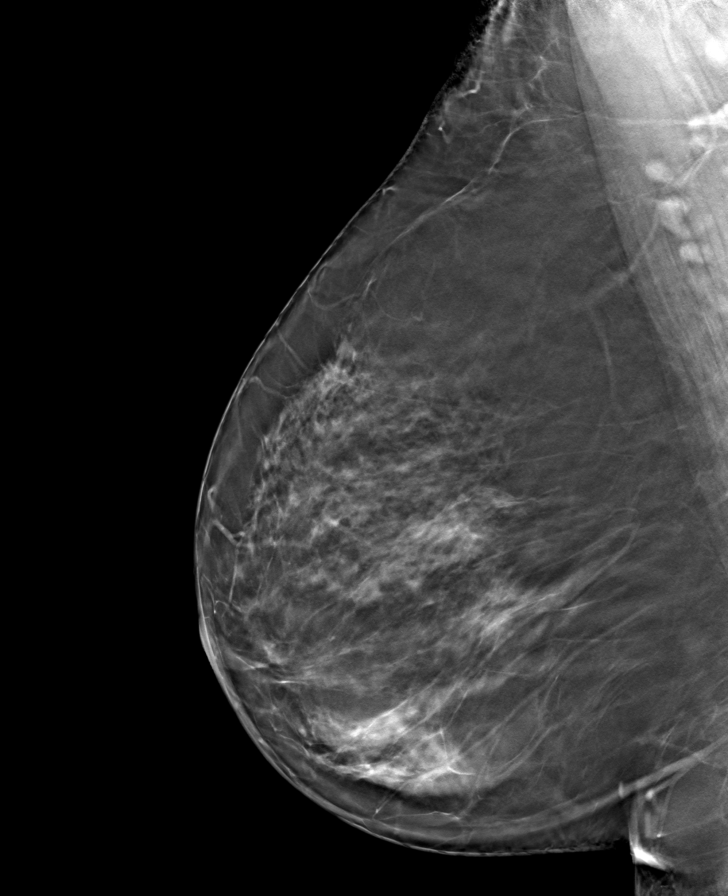

[8 of 24 positions shown; findings below may reference images not displayed]

ACR Breast Density Category c: The breast tissue is heterogeneously
dense, which may obscure small masses.
FINDINGS: In the right breast, a possible asymmetry warrants further
evaluation. In the left breast, no findings suspicious for
malignancy.
IMPRESSION: Further evaluation is suggested for possible asymmetry in the right
breast.

RECOMMENDATION:
Diagnostic mammogram and possibly ultrasound of the right breast.
(Code:[id number])

The patient will be contacted regarding the findings, and additional
imaging will be scheduled.

BI-RADS CATEGORY  0: Incomplete. Need additional imaging evaluation
and/or prior mammograms for comparison.

## 2022-01-28 ENCOUNTER — Ambulatory Visit
Admission: RE | Admit: 2022-01-28 | Discharge: 2022-01-28 | Disposition: A | Discharge status: Home / Self Care | Payer: BC Managed Care – PPO | Source: Ambulatory Visit | Attending: Obstetrics and Gynecology | Admitting: Obstetrics and Gynecology

## 2022-01-28 ENCOUNTER — Ambulatory Visit: Admission: RE | Admit: 2022-01-28 | Payer: BC Managed Care – PPO | Source: Ambulatory Visit

## 2022-01-28 DIAGNOSIS — N6489 Other specified disorders of breast: Secondary | ICD-10-CM

## 2022-02-08 ENCOUNTER — Encounter: Payer: Self-pay | Admitting: Internal Medicine

## 2022-04-19 ENCOUNTER — Other Ambulatory Visit: Payer: Self-pay | Admitting: Physician Assistant

## 2022-04-19 DIAGNOSIS — F32A Depression, unspecified: Secondary | ICD-10-CM

## 2022-04-19 NOTE — Telephone Encounter (Signed)
Pt called and made a cpe for may the 23 she would like her refill for her rx is possible

## 2022-04-19 NOTE — Telephone Encounter (Signed)
Refill request, pt. Last saw Ann Morris for a CPE on 08/12/21 and I have sent her a message to schedule an appointment with you.

## 2022-06-13 ENCOUNTER — Other Ambulatory Visit: Payer: Self-pay | Admitting: Obstetrics and Gynecology

## 2022-06-13 DIAGNOSIS — Z1231 Encounter for screening mammogram for malignant neoplasm of breast: Secondary | ICD-10-CM

## 2022-07-06 ENCOUNTER — Other Ambulatory Visit: Payer: Self-pay | Admitting: Obstetrics and Gynecology

## 2022-07-06 ENCOUNTER — Ambulatory Visit: Payer: BC Managed Care – PPO

## 2022-07-06 ENCOUNTER — Ambulatory Visit
Admission: RE | Admit: 2022-07-06 | Discharge: 2022-07-06 | Disposition: A | Discharge status: Home / Self Care | Payer: BC Managed Care – PPO | Source: Ambulatory Visit | Attending: Obstetrics and Gynecology | Admitting: Obstetrics and Gynecology

## 2022-07-06 DIAGNOSIS — Z1231 Encounter for screening mammogram for malignant neoplasm of breast: Secondary | ICD-10-CM

## 2022-07-06 DIAGNOSIS — N644 Mastodynia: Secondary | ICD-10-CM

## 2022-07-06 DIAGNOSIS — R928 Other abnormal and inconclusive findings on diagnostic imaging of breast: Secondary | ICD-10-CM

## 2022-07-26 ENCOUNTER — Ambulatory Visit: Payer: BC Managed Care – PPO

## 2022-08-18 ENCOUNTER — Encounter: Payer: BC Managed Care – PPO | Admitting: Physician Assistant

## 2022-08-23 ENCOUNTER — Encounter: Payer: Self-pay | Admitting: Nurse Practitioner

## 2022-08-23 ENCOUNTER — Ambulatory Visit (INDEPENDENT_AMBULATORY_CARE_PROVIDER_SITE_OTHER): Payer: BC Managed Care – PPO | Admitting: Nurse Practitioner

## 2022-08-23 VITALS — BP 122/74 | HR 84 | Ht 61.5 in | Wt 214.8 lb

## 2022-08-23 DIAGNOSIS — F988 Other specified behavioral and emotional disorders with onset usually occurring in childhood and adolescence: Secondary | ICD-10-CM

## 2022-08-23 DIAGNOSIS — E6609 Other obesity due to excess calories: Secondary | ICD-10-CM | POA: Diagnosis not present

## 2022-08-23 DIAGNOSIS — E782 Mixed hyperlipidemia: Secondary | ICD-10-CM

## 2022-08-23 DIAGNOSIS — F32A Depression, unspecified: Secondary | ICD-10-CM | POA: Diagnosis not present

## 2022-08-23 DIAGNOSIS — Z0000 Encounter for general adult medical examination without abnormal findings: Secondary | ICD-10-CM | POA: Diagnosis not present

## 2022-08-23 DIAGNOSIS — Z6839 Body mass index (BMI) 39.0-39.9, adult: Secondary | ICD-10-CM

## 2022-08-23 MED ORDER — SERTRALINE HCL 100 MG PO TABS: ORAL_TABLET | ORAL | 3 refills | Status: DC

## 2022-08-23 NOTE — Assessment & Plan Note (Signed)
Chronic and stable on sertraline 100mg  daily. No new or alarm symptoms present.  Plan: - Continue current medication and dose - Notify office if symptoms worsen or change - refills sent for 1 year

## 2022-08-23 NOTE — Assessment & Plan Note (Signed)
Chronic. Well controlled with PRN adderall. She is not in need of refills at this time.  Plan: - OK to send refills when needed over the next 12 months

## 2022-08-23 NOTE — Progress Notes (Signed)
Shawna Clamp, DNP, AGNP-c [facility] [address] [place], [place] [address] Main Office [phone]  BP 122/74   Pulse 84   Ht 5' 1.5" (1.562 m)   Wt 214 lb 12.8 oz (97.4 kg)   BMI 39.93 kg/m    Subjective:    Patient ID: Ann Morris, female    DOB: 1980-04-04, 42 y.o.   MRN: [id number]  HPI: Ann Morris is a 41 y.o. female presenting on 08/23/2022 for comprehensive medical examination.   Current medical concerns include:none  She is taking Adderall as needed. No refills needed at this time.  Her mood is controlled at this time with sertraline 100mg    A comprehensive review of systems was negative.  IMMUNIZATIONS:   Flu: Flu vaccine postponed until flu season Prevnar 13: Prevnar 13 N/A for this patient Prevnar 20: Prevnar 20 N/A for this patient Pneumovax 23: Pneumovax 23 N/A for this patient Vac Shingrix: Shingrix N/A for this patient HPV: HPV N/A for this patient Tetanus: Tetanus completed in the last 10 years COVID: COVID completed, documentation in chart   HEALTH MAINTENANCE: Pap Smear HM Status: is up to date Mammogram HM Status: is up to date Colon Cancer Screening HM Status: is not applicable for this patient Bone Density HM Status: is not applicable for this patient STI Testing HM Status: was declined  Lung CT HM Status: is not applicable for this patient.   She reports regular vision exams q1-5y: Yes  She reports regular dental exams q 19m:  Yes  She reports her diet is a mix of healthy and unhealthy options. She endorses exercise and/or activity of: She was walking at lunch time daily, but hurt her knee and has not been able to do that now.    Most Recent Depression Screen:     08/23/2022    8:21 AM 08/12/2021    8:57 AM 07/27/2020    8:36 AM 07/27/2020    8:35 AM 07/25/2019   10:35 AM  Depression screen PHQ 2/9  Decreased Interest 0 0 0 0 0  Down, Depressed, Hopeless 0 0 0 0 0  PHQ - 2 Score 0 0 0 0 0  Altered sleeping    0    Tired, decreased energy   0    Change in appetite   0    Feeling bad or failure about yourself    0    Trouble concentrating   0    Moving slowly or fidgety/restless   0    Suicidal thoughts   0    PHQ-9 Score   0    Difficult doing work/chores   Not difficult at all     Most Recent Anxiety Screen:      No data to display         Most Recent Fall Screen:    08/23/2022    8:21 AM 08/12/2021    8:57 AM 07/27/2020    8:35 AM 07/25/2019   10:35 AM 02/15/2018    9:34 AM  Fall Risk   Falls in the past year? 0 0 0 0 0  Number falls in past yr: 0 0 0 0 0  Injury with Fall? 0 0 0 0 0  Risk for fall due to : No Fall Risks No Fall Risks No Fall Risks    Follow up Falls evaluation completed Falls evaluation completed Falls evaluation completed      Past medical history, surgical history, medications, allergies, family history and social history reviewed with  patient today and changes made to appropriate areas of the chart.  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   No current facility-administered medications on file prior to visit.   Surgical History:  Past Surgical History:  Procedure Laterality Date   COLPOSCOPY W/ BIOPSY / CURETTAGE     WISDOM TOOTH EXTRACTION     Allergies:  No Known  Allergies Family History:  Family History  Problem Relation Age of Onset   Colon polyps Mother    Hypertension Mother    Diabetes Father    Asthma Father    Thyroid disease Sister    Hyperlipidemia Brother    Colon cancer Maternal Grandmother        dx in 78s   Heart disease Maternal Grandfather    Heart attack Paternal Grandfather    Diabetes Paternal Grandfather    Heart disease Paternal Grandfather    Esophageal cancer Neg Hx    Pancreatic cancer Neg Hx    Stomach cancer Neg Hx    Breast cancer Neg Hx    CVA Neg Hx        Objective:    BP 122/74   Pulse 84   Ht 5' 1.5" (1.562 m)   Wt 214 lb 12.8 oz (97.4 kg)   BMI 39.93 kg/m   Wt Readings from Last 3 Encounters:  08/23/22 214 lb 12.8 oz (97.4 kg)  08/12/21 207 lb (93.9 kg)  11/03/20 203 lb (92.1 kg)    Physical Exam Vitals and nursing note reviewed.  Constitutional:      General: She is not in acute distress.    Appearance: Normal appearance.  HENT:     Head: Normocephalic and atraumatic.     Right Ear: Hearing, tympanic membrane, ear canal and external ear normal.     Left Ear: Hearing, tympanic membrane, ear canal and external ear normal.     Nose: Nose normal.     Right Sinus: No maxillary sinus tenderness or frontal sinus tenderness.     Left Sinus: No maxillary sinus tenderness or frontal sinus tenderness.     Mouth/Throat:     Lips: Pink.     Mouth: Mucous membranes are moist.     Pharynx: Oropharynx is clear.  Eyes:     General: Lids are normal. Vision grossly intact. No scleral icterus.    Extraocular Movements: Extraocular movements intact.     Conjunctiva/sclera: Conjunctivae normal.     Pupils: Pupils are equal, round, and reactive to light.     Funduscopic exam:    Right eye: Red reflex present.        Left eye: Red reflex present.    Visual Fields: Right eye visual fields normal and left eye visual fields normal.  Neck:  Thyroid: No thyromegaly.     Vascular: No carotid bruit.   Cardiovascular:     Rate and Rhythm: Normal rate and regular rhythm.     Chest Wall: PMI is not displaced.     Pulses: Normal pulses.          Dorsalis pedis pulses are 2+ on the right side and 2+ on the left side.       Posterior tibial pulses are 2+ on the right side and 2+ on the left side.     Heart sounds: Normal heart sounds. No murmur heard. Pulmonary:     Effort: Pulmonary effort is normal. No respiratory distress.     Breath sounds: Normal breath sounds.  Abdominal:     General: Abdomen is flat. Bowel sounds are normal. There is no distension.     Palpations: Abdomen is soft. There is no hepatomegaly, splenomegaly or mass.     Tenderness: There is no abdominal tenderness. There is no right CVA tenderness, left CVA tenderness, guarding or rebound.  Musculoskeletal:        General: Signs of injury present. Normal range of motion.     Cervical back: Full passive range of motion without pain, normal range of motion and neck supple. No tenderness.     Right lower leg: No edema.     Left lower leg: No edema.     Comments: Left knee pain due to injury.   Feet:     Left foot:     Toenail Condition: Left toenails are normal.  Lymphadenopathy:     Cervical: No cervical adenopathy.     Upper Body:     Right upper body: No supraclavicular adenopathy.     Left upper body: No supraclavicular adenopathy.  Skin:    General: Skin is warm and dry.     Capillary Refill: Capillary refill takes less than 2 seconds.     Nails: There is no clubbing.  Neurological:     General: No focal deficit present.     Mental Status: She is alert and oriented to person, place, and time.     GCS: GCS eye subscore is 4. GCS verbal subscore is 5. GCS motor subscore is 6.     Sensory: Sensation is intact.     Motor: Motor function is intact. No weakness.     Coordination: Coordination is intact.     Gait: Gait is intact.     Deep Tendon Reflexes: Reflexes are normal and symmetric. Reflexes normal.   Psychiatric:        Attention and Perception: Attention normal.        Mood and Affect: Mood normal.        Speech: Speech normal.        Behavior: Behavior normal. Behavior is cooperative.        Cognition and Memory: Cognition and memory normal.     Results for orders placed or performed in visit on 08/12/21  CBC with Differential/Platelet  Result Value Ref Range   WBC 5.2 3.4 - 10.8 x10E3/uL   RBC 4.44 3.77 - 5.28 x10E6/uL   Hemoglobin 13.7 11.1 - 15.9 g/dL   Hematocrit 16.1 09.6 - 46.6 %   MCV 91 79 - 97 fL   MCH 30.9 26.6 - 33.0 pg   MCHC 34.0 31.5 - 35.7 g/dL   RDW 04.5 40.9 - 81.1 %   Platelets 226 150 - 450 x10E3/uL   Neutrophils 55 Not Estab. %   Lymphs 34  Not Estab. %   Monocytes 7 Not Estab. %   Eos 4 Not Estab. %   Basos 0 Not Estab. %   Neutrophils Absolute 2.8 1.4 - 7.0 x10E3/uL   Lymphocytes Absolute 1.8 0.7 - 3.1 x10E3/uL   Monocytes Absolute 0.4 0.1 - 0.9 x10E3/uL   EOS (ABSOLUTE) 0.2 0.0 - 0.4 x10E3/uL   Basophils Absolute 0.0 0.0 - 0.2 x10E3/uL   Immature Granulocytes 0 Not Estab. %   Immature Grans (Abs) 0.0 0.0 - 0.1 x10E3/uL  Comprehensive metabolic panel  Result Value Ref Range   Glucose 89 70 - 99 mg/dL   BUN 12 6 - 24 mg/dL   Creatinine, Ser 1.61 0.57 - 1.00 mg/dL   eGFR 096 >04 VW/UJW/1.19   BUN/Creatinine Ratio 17 9 - 23   Sodium 138 134 - 144 mmol/L   Potassium 4.8 3.5 - 5.2 mmol/L   Chloride 101 96 - 106 mmol/L   CO2 24 20 - 29 mmol/L   Calcium 9.1 8.7 - 10.2 mg/dL   Total Protein 7.0 6.0 - 8.5 g/dL   Albumin 4.5 3.8 - 4.8 g/dL   Globulin, Total 2.5 1.5 - 4.5 g/dL   Albumin/Globulin Ratio 1.8 1.2 - 2.2   Bilirubin Total <0.2 0.0 - 1.2 mg/dL   Alkaline Phosphatase 89 44 - 121 IU/L   AST 23 0 - 40 IU/L   ALT 20 0 - 32 IU/L  Lipid panel  Result Value Ref Range   Cholesterol, Total 218 (H) 100 - 199 mg/dL   Triglycerides 147 0 - 149 mg/dL   HDL 53 >82 mg/dL   VLDL Cholesterol Cal 22 5 - 40 mg/dL   LDL Chol Calc (NIH) 956 (H) 0 - 99  mg/dL   Chol/HDL Ratio 4.1 0.0 - 4.4 ratio         Assessment & Plan:   Problem List Items Addressed This Visit     Depression    Chronic and stable on sertraline 100mg  daily. No new or alarm symptoms present.  Plan: - Continue current medication and dose - Notify office if symptoms worsen or change - refills sent for 1 year      Relevant Medications   sertraline (ZOLOFT) 100 MG tablet   ADD (attention deficit disorder) without hyperactivity    Chronic. Well controlled with PRN adderall. She is not in need of refills at this time.  Plan: - OK to send refills when needed over the next 12 months      Mixed hyperlipidemia    Chronic. No medication.  Plan: - Labs pending today      Relevant Medications   sertraline (ZOLOFT) 100 MG tablet   Other Relevant Orders   Hemoglobin A1c   CBC with Differential/Platelet   Comprehensive metabolic panel   Lipid Cascade   Class 2 obesity due to excess calories without serious comorbidity with body mass index (BMI) of 39.0 to 39.9 in adult    Chronic. Not able to exercise at this time due to injury, but has been working on walking daily at lunch.  Plan: - Labs pending - Continue walking once injury has healed      Relevant Medications   sertraline (ZOLOFT) 100 MG tablet   Other Relevant Orders   Hemoglobin A1c   CBC with Differential/Platelet   Comprehensive metabolic panel   Lipid Cascade   Other Visit Diagnoses     Encounter for annual physical exam    -  Primary   Relevant Medications  sertraline (ZOLOFT) 100 MG tablet   Other Relevant Orders   Hemoglobin A1c   CBC with Differential/Platelet   Comprehensive metabolic panel   Lipid Cascade          Follow up plan: Return in about 1 year (around 08/23/2023) for CPE.  NEXT PREVENTATIVE PHYSICAL DUE IN 1 YEAR.  PATIENT COUNSELING PROVIDED FOR ALL ADULT PATIENTS: A well balanced diet low in saturated fats, cholesterol, and moderation in carbohydrates.  This  can be as simple as monitoring portion sizes and cutting back on sugary beverages such as soda and juice to start with.    Daily water consumption of at least 64 ounces.  Physical activity at least 180 minutes per week.  If just starting out, start 10 minutes a day and work your way up.   This can be as simple as taking the stairs instead of the elevator and walking 2-3 laps around the office  purposefully every day.   STD protection, partner selection, and regular testing if high risk.  Limited consumption of alcoholic beverages if alcohol is consumed. For men, I recommend no more than 14 alcoholic beverages per week, spread out throughout the week (max 2 per day). Avoid "binge" drinking or consuming large quantities of alcohol in one setting.  Please let me know if you feel you may need help with reduction or quitting alcohol consumption.   Avoidance of nicotine, if used. Please let me know if you feel you may need help with reduction or quitting nicotine use.   Daily mental health attention. This can be in the form of 5 minute daily meditation, prayer, journaling, yoga, reflection, etc.  Purposeful attention to your emotions and mental state can significantly improve your overall wellbeing  and  Health.  Please know that I am here to help you with all of your health care goals and am happy to work with you to find a solution that works best for you.  The greatest advice I have received with any changes in life are to take it one step at a time, that even means if all you can focus on is the next 60 seconds, then do that and celebrate your victories.  With any changes in life, you will have set backs, and that is OK. The important thing to remember is, if you have a set back, it is not a failure, it is an opportunity to try again! Screening Testing Mammogram Every 1 -2 years based on history and risk factors Starting at age 54 Pap Smear Ages 21-39 every 3 years Ages 56-65 every 5  years with HPV testing More frequent testing may be required based on results and history Colon Cancer Screening Every 1-10 years based on test performed, risk factors, and history Starting at age 10 Bone Density Screening Every 2-10 years based on history Starting at age 50 for women Recommendations for men differ based on medication usage, history, and risk factors AAA Screening One time ultrasound Men 52-33 years old who have every smoked Lung Cancer Screening Low Dose Lung CT every 12 months Age 62-80 years with a 30 pack-year smoking history who still smoke or who have quit within the last 15 years   Screening Labs Routine  Labs: Complete Blood Count (CBC), Complete Metabolic Panel (CMP), Cholesterol (Lipid Panel) Every 6-12 months based on history and medications May be recommended more frequently based on current conditions or previous results Hemoglobin A1c Lab Every 3-12 months based on history and previous results Starting  at age 17 or earlier with diagnosis of diabetes, high cholesterol, BMI >26, and/or risk factors Frequent monitoring for patients with diabetes to ensure blood sugar control Thyroid Panel (TSH) Every 6 months based on history, symptoms, and risk factors May be repeated more often if on medication HIV One time testing for all patients 56 and older May be repeated more frequently for patients with increased risk factors or exposure Hepatitis C One time testing for all patients 40 and older May be repeated more frequently for patients with increased risk factors or exposure Gonorrhea, Chlamydia Every 12 months for all sexually active persons 13-24 years Additional monitoring may be recommended for those who are considered high risk or who have symptoms Every 12 months for any woman on birth control, regardless of sexual activity PSA Men 12-39 years old with risk factors Additional screening may be recommended from age 66-69 based on risk factors,  symptoms, and history  Vaccine Recommendations Tetanus Booster All adults every 10 years Flu Vaccine All patients 6 months and older every year COVID Vaccine All patients 12 years and older Initial dosing with booster May recommend additional booster based on age and health history HPV Vaccine 2 doses all patients age 25-26 Dosing may be considered for patients over 26 Shingles Vaccine (Shingrix) 2 doses all adults 55 years and older Pneumonia (Pneumovax 2) All adults 65 years and older May recommend earlier dosing based on health history One year apart from Prevnar 24 Pneumonia (Prevnar 29) All adults 65 years and older Dosed 1 year after Pneumovax 23 Pneumonia (Prevnar 20) One time alternative to the two dosing of 13 and 23 For all adults with initial dose of 23, 20 is recommended 1 year later For all adults with initial dose of 13, 23 is still recommended as second option 1 year later

## 2022-08-23 NOTE — Assessment & Plan Note (Signed)
Chronic. Not able to exercise at this time due to injury, but has been working on walking daily at lunch.  Plan: - Labs pending - Continue walking once injury has healed

## 2022-08-23 NOTE — Assessment & Plan Note (Signed)
Chronic. No medication.  Plan: - Labs pending today

## 2022-08-23 NOTE — Patient Instructions (Signed)
If you are in need of Adderall refills, please let me know.   I have sent your sertraline in for a year.      For all adult patients, I recommend A well balanced diet low in saturated fats, cholesterol, and moderation in carbohydrates.   This can be as simple as monitoring portion sizes and cutting back on sugary beverages such as soda and juice to start with.    Daily water consumption of at least 64 ounces.  Physical activity at least 180 minutes per week, if just starting out.   This can be as simple as taking the stairs instead of the elevator and walking 2-3 laps around the office  purposefully every day.   STD protection, partner selection, and regular testing if high risk.  Limited consumption of alcoholic beverages if alcohol is consumed.  For women, I recommend no more than 7 alcoholic beverages per week, spread out throughout the week.  Avoid "binge" drinking or consuming large quantities of alcohol in one setting.   Please let me know if you feel you may need help with reduction or quitting alcohol consumption.   Avoidance of nicotine, if used.  Please let me know if you feel you may need help with reduction or quitting nicotine use.   Daily mental health attention.  This can be in the form of 5 minute daily meditation, prayer, journaling, yoga, reflection, etc.   Purposeful attention to your emotions and mental state can significantly improve your overall wellbeing  and  Health.  Please know that I am here to help you with all of your health care goals and am happy to work with you to find a solution that works best for you.  The greatest advice I have received with any changes in life are to take it one step at a time, that even means if all you can focus on is the next 60 seconds, then do that and celebrate your victories.  With any changes in life, you will have set backs, and that is OK. The important thing to remember is, if you have a set back, it is not a failure,  it is an opportunity to try again!  Health Maintenance Recommendations Screening Testing Mammogram Every 1 -2 years based on history and risk factors Starting at age 23 Pap Smear Ages 21-39 every 3 years Ages 72-65 every 5 years with HPV testing More frequent testing may be required based on results and history Colon Cancer Screening Every 1-10 years based on test performed, risk factors, and history Starting at age 5 Bone Density Screening Every 2-10 years based on history Starting at age 65 for women Recommendations for men differ based on medication usage, history, and risk factors AAA Screening One time ultrasound Men 49-52 years old who have every smoked Lung Cancer Screening Low Dose Lung CT every 12 months Age 48-80 years with a 30 pack-year smoking history who still smoke or who have quit within the last 15 years  Screening Labs Routine  Labs: Complete Blood Count (CBC), Complete Metabolic Panel (CMP), Cholesterol (Lipid Panel) Every 6-12 months based on history and medications May be recommended more frequently based on current conditions or previous results Hemoglobin A1c Lab Every 3-12 months based on history and previous results Starting at age 42 or earlier with diagnosis of diabetes, high cholesterol, BMI >26, and/or risk factors Frequent monitoring for patients with diabetes to ensure blood sugar control Thyroid Panel (TSH w/ T3 & T4) Every 6 months  based on history, symptoms, and risk factors May be repeated more often if on medication HIV One time testing for all patients 10 and older May be repeated more frequently for patients with increased risk factors or exposure Hepatitis C One time testing for all patients 9 and older May be repeated more frequently for patients with increased risk factors or exposure Gonorrhea, Chlamydia Every 12 months for all sexually active persons 13-24 years Additional monitoring may be recommended for those who are  considered high risk or who have symptoms PSA Men 61-56 years old with risk factors Additional screening may be recommended from age 76-69 based on risk factors, symptoms, and history  Vaccine Recommendations Tetanus Booster All adults every 10 years Flu Vaccine All patients 6 months and older every year COVID Vaccine All patients 12 years and older Initial dosing with booster May recommend additional booster based on age and health history HPV Vaccine 2 doses all patients age 60-26 Dosing may be considered for patients over 26 Shingles Vaccine (Shingrix) 2 doses all adults 55 years and older Pneumonia (Pneumovax 23) All adults 65 years and older May recommend earlier dosing based on health history Pneumonia (Prevnar 41) All adults 65 years and older Dosed 1 year after Pneumovax 23  Additional Screening, Testing, and Vaccinations may be recommended on an individualized basis based on family history, health history, risk factors, and/or exposure.

## 2022-08-24 LAB — COMPREHENSIVE METABOLIC PANEL
ALT: 14 IU/L (ref 0–32)
AST: 17 IU/L (ref 0–40)
Albumin/Globulin Ratio: 2 (ref 1.2–2.2)
Albumin: 4.5 g/dL (ref 3.9–4.9)
Alkaline Phosphatase: 94 IU/L (ref 44–121)
BUN/Creatinine Ratio: 15 (ref 9–23)
BUN: 9 mg/dL (ref 6–24)
Bilirubin Total: 0.2 mg/dL (ref 0.0–1.2)
CO2: 21 mmol/L (ref 20–29)
Calcium: 8.9 mg/dL (ref 8.7–10.2)
Chloride: 102 mmol/L (ref 96–106)
Creatinine, Ser: 0.62 mg/dL (ref 0.57–1.00)
Globulin, Total: 2.2 g/dL (ref 1.5–4.5)
Glucose: 91 mg/dL (ref 70–99)
Potassium: 4.3 mmol/L (ref 3.5–5.2)
Sodium: 140 mmol/L (ref 134–144)
Total Protein: 6.7 g/dL (ref 6.0–8.5)
eGFR: 114 mL/min/{1.73_m2} (ref >59)

## 2022-08-24 LAB — CBC WITH DIFFERENTIAL/PLATELET
Basophils Absolute: 0 10*3/uL (ref 0.0–0.2)
Basos: 0 %
EOS (ABSOLUTE): 0.2 10*3/uL (ref 0.0–0.4)
Eos: 3 %
Hematocrit: 40.2 % (ref 34.0–46.6)
Hemoglobin: 13.1 g/dL (ref 11.1–15.9)
Immature Grans (Abs): 0 10*3/uL (ref 0.0–0.1)
Immature Granulocytes: 0 %
Lymphocytes Absolute: 1.6 10*3/uL (ref 0.7–3.1)
Lymphs: 26 %
MCH: 29.8 pg (ref 26.6–33.0)
MCHC: 32.6 g/dL (ref 31.5–35.7)
MCV: 91 fL (ref 79–97)
Monocytes Absolute: 0.3 10*3/uL (ref 0.1–0.9)
Monocytes: 6 %
Neutrophils Absolute: 3.9 10*3/uL (ref 1.4–7.0)
Neutrophils: 65 %
Platelets: 195 10*3/uL (ref 150–450)
RBC: 4.4 x10E6/uL (ref 3.77–5.28)
RDW: 13 % (ref 11.7–15.4)
WBC: 6 10*3/uL (ref 3.4–10.8)

## 2022-08-24 LAB — LIPID CASCADE
Cholesterol, Total: 185 mg/dL (ref 100–199)
HDL: 53 mg/dL (ref >39)
LDL Chol Calc (NIH): 106 mg/dL — ABNORMAL HIGH (ref 0–99)
LDL/HDL Ratio: 2 ratio (ref 0.0–3.2)
Total Non-HDL-Chol (LDL+VLDL): 132 mg/dL — ABNORMAL HIGH (ref 0–129)
Triglycerides: 151 mg/dL — ABNORMAL HIGH (ref 0–149)

## 2022-08-24 LAB — HEMOGLOBIN A1C
Est. average glucose Bld gHb Est-mCnc: 111 mg/dL
Hgb A1c MFr Bld: 5.5 % (ref 4.8–5.6)

## 2022-08-24 LAB — LIPOPROTEIN ANALYSIS, BY NMR
HDL Particle Number: 43.3 umol/L (ref >=30.5)
LDL Particle Number: 1317 nmol/L — ABNORMAL HIGH (ref <1000)
LDL Size: 21.3 nm (ref >20.5)
LP-IR Score: 56 — ABNORMAL HIGH (ref <=45)
Small LDL Particle Number: 485 nmol/L (ref <=527)

## 2022-10-18 ENCOUNTER — Other Ambulatory Visit: Payer: Self-pay | Admitting: Physician Assistant

## 2022-10-18 DIAGNOSIS — F988 Other specified behavioral and emotional disorders with onset usually occurring in childhood and adolescence: Secondary | ICD-10-CM

## 2022-10-18 MED ORDER — AMPHETAMINE-DEXTROAMPHETAMINE 20 MG PO TABS: 20.0000 mg | ORAL_TABLET | Freq: Every day | ORAL | 0 refills | Status: DC

## 2022-10-18 NOTE — Telephone Encounter (Signed)
Last prescribed 06/24/2021 Last office visit: 08/23/2022

## 2022-10-20 ENCOUNTER — Encounter: Payer: Self-pay | Admitting: Nurse Practitioner

## 2022-10-25 ENCOUNTER — Telehealth: Payer: Self-pay | Admitting: Nurse Practitioner

## 2022-10-25 NOTE — Telephone Encounter (Signed)
Done & Approved & sent pt mychart message

## 2022-10-25 NOTE — Telephone Encounter (Signed)
PA Case ID #: [id number] Need Help? Call us at [phone] Outcome Additional Information Required This request has been approved using information available on the patient's profile. [id number];Review Type:Prior Auth;Coverage Start Date:09/25/2022;Coverage End Date:10/25/2023; Drug Amphetamine-Dextroamphetamine 20MG  tablets ePA cloud logo Form [employer] Electronic PA Form

## 2022-11-02 ENCOUNTER — Telehealth: Payer: Self-pay | Admitting: Nurse Practitioner

## 2022-11-02 NOTE — Telephone Encounter (Signed)
Pt left voicemail re lipoprotein done at her physical, insurance is not covering it, they consider it investigational and insurance questioning why this test done when there are other options

## 2022-11-07 NOTE — Telephone Encounter (Signed)
Spoke with patient, she has not seen the documentation from [facility]  yet, she will look at that and contact her insurance a

## 2022-11-24 HISTORY — PX: ANTERIOR CRUCIATE LIGAMENT REPAIR: SHX115

## 2023-05-09 ENCOUNTER — Encounter: Payer: Self-pay | Admitting: Nurse Practitioner

## 2023-05-09 DIAGNOSIS — T753XXA Motion sickness, initial encounter: Secondary | ICD-10-CM

## 2023-05-11 MED ORDER — SCOPOLAMINE 1 MG/3DAYS TD PT72SCOPOLAMINE 1 MG/3DAYS
1.0000 | MEDICATED_PATCH | TRANSDERMAL | 1 refills | Status: DC
Start: 2023-05-11 — End: 2023-08-29

## 2023-06-21 ENCOUNTER — Other Ambulatory Visit: Payer: Self-pay | Admitting: Family Medicine

## 2023-06-21 DIAGNOSIS — F988 Other specified behavioral and emotional disorders with onset usually occurring in childhood and adolescence: Secondary | ICD-10-CM

## 2023-06-28 ENCOUNTER — Encounter: Payer: Self-pay | Admitting: Nurse Practitioner

## 2023-06-28 ENCOUNTER — Other Ambulatory Visit: Payer: Self-pay | Admitting: Nurse Practitioner

## 2023-06-28 DIAGNOSIS — Z1231 Encounter for screening mammogram for malignant neoplasm of breast: Secondary | ICD-10-CM

## 2023-06-28 MED ORDER — AMPHETAMINE-DEXTROAMPHETAMINE 20 MG PO TABS: 20.0000 mg | ORAL_TABLET | Freq: Every day | ORAL | 0 refills | Status: DC

## 2023-07-11 ENCOUNTER — Other Ambulatory Visit: Payer: Self-pay | Admitting: Nurse Practitioner

## 2023-07-11 DIAGNOSIS — N6489 Other specified disorders of breast: Secondary | ICD-10-CM

## 2023-07-11 DIAGNOSIS — Z1231 Encounter for screening mammogram for malignant neoplasm of breast: Secondary | ICD-10-CM

## 2023-08-04 ENCOUNTER — Ambulatory Visit
Admission: RE | Admit: 2023-08-04 | Discharge: 2023-08-04 | Disposition: A | Discharge status: Home / Self Care | Source: Ambulatory Visit | Attending: Nurse Practitioner | Admitting: Nurse Practitioner

## 2023-08-04 DIAGNOSIS — N6489 Other specified disorders of breast: Secondary | ICD-10-CM

## 2023-08-08 ENCOUNTER — Ambulatory Visit: Payer: Self-pay

## 2023-08-08 ENCOUNTER — Encounter: Payer: Self-pay | Admitting: Nurse Practitioner

## 2023-08-25 ENCOUNTER — Encounter: Payer: BC Managed Care – PPO | Admitting: Nurse Practitioner

## 2023-08-29 ENCOUNTER — Ambulatory Visit (INDEPENDENT_AMBULATORY_CARE_PROVIDER_SITE_OTHER): Payer: BC Managed Care – PPO | Admitting: Nurse Practitioner

## 2023-08-29 ENCOUNTER — Encounter: Payer: Self-pay | Admitting: Nurse Practitioner

## 2023-08-29 VITALS — BP 128/80 | HR 64 | Ht 61.0 in | Wt 215.6 lb

## 2023-08-29 DIAGNOSIS — F988 Other specified behavioral and emotional disorders with onset usually occurring in childhood and adolescence: Secondary | ICD-10-CM | POA: Diagnosis not present

## 2023-08-29 DIAGNOSIS — R1011 Right upper quadrant pain: Secondary | ICD-10-CM | POA: Insufficient documentation

## 2023-08-29 DIAGNOSIS — E66812 Obesity, class 2: Secondary | ICD-10-CM

## 2023-08-29 DIAGNOSIS — E782 Mixed hyperlipidemia: Secondary | ICD-10-CM | POA: Diagnosis not present

## 2023-08-29 DIAGNOSIS — Z0000 Encounter for general adult medical examination without abnormal findings: Secondary | ICD-10-CM | POA: Diagnosis not present

## 2023-08-29 DIAGNOSIS — N951 Menopausal and female climacteric states: Secondary | ICD-10-CM | POA: Insufficient documentation

## 2023-08-29 DIAGNOSIS — Z800 Family history of malignant neoplasm of digestive organs: Secondary | ICD-10-CM | POA: Diagnosis not present

## 2023-08-29 DIAGNOSIS — Z79899 Other long term (current) drug therapy: Secondary | ICD-10-CM

## 2023-08-29 DIAGNOSIS — Z6839 Body mass index (BMI) 39.0-39.9, adult: Secondary | ICD-10-CM

## 2023-08-29 DIAGNOSIS — E6609 Other obesity due to excess calories: Secondary | ICD-10-CM

## 2023-08-29 DIAGNOSIS — F32A Depression, unspecified: Secondary | ICD-10-CM

## 2023-08-29 DIAGNOSIS — N924 Excessive bleeding in the premenopausal period: Secondary | ICD-10-CM

## 2023-08-29 MED ORDER — SERTRALINE HCL 100 MG PO TABS: ORAL_TABLET | ORAL | 3 refills | Status: AC

## 2023-08-29 NOTE — Assessment & Plan Note (Signed)
Chronic. Well controlled with PRN adderall. She is not in need of refills at this time.  Plan: - OK to send refills when needed over the next 12 months

## 2023-08-29 NOTE — Assessment & Plan Note (Signed)
 Experiencing mood changes, energy level fluctuations, and temperamental behavior, likely related to perimenopause. Discussed common perimenopausal symptoms and their impact on daily life. - Recommend calcium and vitamin D supplementation - Encourage weight-bearing exercise

## 2023-08-29 NOTE — Assessment & Plan Note (Addendum)
 Heavy menstrual periods, currently on tranexamic acid with minimal effect. Symptoms may be related to perimenopausal changes. Discussed the limited effectiveness of current treatment and potential need for alternative management strategies. Recommend reach out to GYN for additional recommendations.

## 2023-08-29 NOTE — Assessment & Plan Note (Signed)

## 2023-08-29 NOTE — Assessment & Plan Note (Signed)
 Intermittent right-sided abdominal pain for a few months, sometimes sharp and radiating to the back. Differential diagnosis includes gallbladder colic and kidney stones. Gallbladder colic is suspected due to the nature of the pain and its association with fatty meals. Stress may also be a contributing factor. - Order laboratory tests to check for inflammation and liver function - Refer to gastroenterology for further evaluation

## 2023-08-29 NOTE — Assessment & Plan Note (Signed)
 Chronic and stable. Some mood changes with perimenopause, but nothing consistent. Does not feel medication changes are needed at this time.

## 2023-08-29 NOTE — Assessment & Plan Note (Signed)
 Repeat labs today. Not currently managed with medication. Diet and exercise information provided.

## 2023-08-29 NOTE — Assessment & Plan Note (Signed)
 Discussion of diet and exercise for management. Labs pending to rule out underlying causes.

## 2023-08-29 NOTE — Progress Notes (Signed)
 Dell Fennel, DNP, AGNP-c [facility] [address] [place], [place] [address] Main Office [phone]  BP 128/80   Pulse 64   Ht 5\' 1"  (1.549 m)   Wt 215 lb 9.6 oz (97.8 kg)   LMP 08/18/2023   BMI 40.74 kg/m    Subjective:    Patient ID: Ann Morris, female    DOB: [birth year], 43 y.o.   MRN: [id number]  History of Present Illness Ann Morris is a 43 year old female who presents for CPE with concerns of right-sided abdominal pain and a family history of colon cancer.  She has been experiencing intermittent right-sided abdominal pain for the past few months. The pain is described as sometimes sharp and sometimes similar to menstrual cramps, occasionally radiating to her back. It occurs daily, has been more noticeable recently, and lacks specific patterns or triggers.  She has a significant family history of colon cancer. Her maternal grandmother passed away from the disease, and her mother had precancerous polyps discovered at age 39. Her siblings have undergone normal colonoscopies.  She experiences heavy menstrual periods and is currently taking tranexamic acid, which has not significantly improved her symptoms. She also reports mood changes, energy fluctuations, and increased temperamental behavior over the past eight months, which she attributes to hormonal changes.  She has a history of ADHD and takes medication for it a couple of days a week, using only half a pill when needed. She has been experiencing more diarrhea recently, which she attributes to taking Metamucil.  No shortness of breath, dizziness, chest pain, vision changes, hearing issues, difficulty swallowing, fullness in the throat, or swelling in the feet. Reports increased diarrhea. She works in Therap[employer], sports and has been experiencing significant stress over the past eight months due to work-related issues.   Pertinent items are noted in HPI.  Most Recent Depression Screen:      08/29/2023    8:15 AM 08/23/2022    8:21 AM 08/12/2021    8:57 AM 07/27/2020    8:36 AM 07/27/2020    8:35 AM  Depression screen PHQ 2/9  Decreased Interest 0 0 0 0 0  Down, Depressed, Hopeless 0 0 0 0 0  PHQ - 2 Score 0 0 0 0 0  Altered sleeping    0   Tired, decreased energy    0   Change in appetite    0   Feeling bad or failure about yourself     0   Trouble concentrating    0   Moving slowly or fidgety/restless    0   Suicidal thoughts    0   PHQ-9 Score    0   Difficult doing work/chores    Not difficult at all    Most Recent Anxiety Screen:      No data to display         Most Recent Fall Screen:    08/29/2023    8:15 AM 08/23/2022    8:21 AM 08/12/2021    8:57 AM 07/27/2020    8:35 AM 07/25/2019   10:35 AM  Fall Risk   Falls in the past year? 0 0 0 0 0  Number falls in past yr: 0 0 0 0 0  Injury with Fall? 0 0 0 0 0  Risk for fall due to : No Fall Risks No Fall Risks No Fall Risks No Fall Risks   Follow up Falls evaluation completed Falls evaluation completed Falls evaluation completed Falls evaluation completed  Past medical history, surgical history, medications, allergies, family history and social history reviewed with patient today and changes made to appropriate areas of the chart.  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   tranexamic acid (LYSTEDA) 650 MG TABS tablet Take 1,300 mg by mouth 3 (three) times daily.   No current facility-administered medications on file prior to visit.   Surgical History:  Past Surgical History:  Procedure Laterality Date   COLPOSCOPY W/ BIOPSY / CURETTAGE     WISDOM TOOTH EXTRACTION     Allergies:  No Known Allergies Family History:  Family History  Problem Relation Age of Onset   Colon polyps Mother    Hypertension Mother    Diabetes Father    Asthma Father    Thyroid disease Sister    Hyperlipidemia Brother    Colon cancer Maternal Grandmother        dx in 86s   Heart disease Maternal Grandfather    Heart attack Paternal Grandfather    Diabetes Paternal Grandfather    Heart disease Paternal Grandfather    Esophageal cancer Neg Hx    Pancreatic cancer Neg Hx    Stomach cancer Neg Hx    Breast cancer Neg Hx    CVA Neg Hx        Objective:    BP 128/80   Pulse 64   Ht 5\' 1"  (1.549 m)   Wt 215 lb 9.6 oz (97.8 kg)   LMP 08/18/2023   BMI 40.74 kg/m   Wt Readings from Last 3 Encounters:  08/29/23 215 lb 9.6 oz (97.8 kg)  08/23/22 214 lb 12.8 oz (97.4 kg)  08/12/21 207 lb (93.9 kg)    Physical Exam Vitals and nursing note reviewed.  Constitutional:      General: She is not in acute distress.    Appearance: Normal appearance. She is obese.  HENT:     Head: Normocephalic and atraumatic.     Right Ear: Hearing, tympanic membrane, ear canal and external ear normal.     Left Ear: Hearing, tympanic membrane, ear canal and external ear normal.     Nose: Nose normal.     Right Sinus: No maxillary sinus tenderness or frontal sinus tenderness.     Left Sinus: No maxillary sinus tenderness or frontal sinus tenderness.     Mouth/Throat:     Lips: Pink.     Mouth: Mucous membranes are moist.     Pharynx: Oropharynx is clear.  Eyes:     General: Lids are normal. Vision grossly intact.     Extraocular Movements:  Extraocular movements intact.     Conjunctiva/sclera: Conjunctivae normal.     Pupils: Pupils are equal, round, and reactive to light.     Funduscopic exam:  Right eye: Red reflex present.        Left eye: Red reflex present.    Visual Fields: Right eye visual fields normal and left eye visual fields normal.  Neck:     Thyroid: No thyromegaly.     Vascular: No carotid bruit.  Cardiovascular:     Rate and Rhythm: Normal rate and regular rhythm.     Chest Wall: PMI is not displaced.     Pulses: Normal pulses.          Dorsalis pedis pulses are 2+ on the right side and 2+ on the left side.       Posterior tibial pulses are 2+ on the right side and 2+ on the left side.     Heart sounds: Normal heart sounds. No murmur heard. Pulmonary:     Effort: Pulmonary effort is normal. No respiratory distress.     Breath sounds: Normal breath sounds.  Abdominal:     General: Abdomen is flat. Bowel sounds are normal. There is no distension.     Palpations: Abdomen is soft. There is no hepatomegaly, splenomegaly or mass.     Tenderness: There is no abdominal tenderness. There is no right CVA tenderness, left CVA tenderness, guarding or rebound.  Musculoskeletal:        General: Normal range of motion.     Cervical back: Full passive range of motion without pain, normal range of motion and neck supple. No tenderness.     Right lower leg: No edema.     Left lower leg: No edema.  Feet:     Left foot:     Toenail Condition: Left toenails are normal.  Lymphadenopathy:     Cervical: No cervical adenopathy.     Upper Body:     Right upper body: No supraclavicular adenopathy.     Left upper body: No supraclavicular adenopathy.  Skin:    General: Skin is warm and dry.     Capillary Refill: Capillary refill takes less than 2 seconds.     Nails: There is no clubbing.  Neurological:     General: No focal deficit present.     Mental Status: She is alert and oriented to person, place, and time.      GCS: GCS eye subscore is 4. GCS verbal subscore is 5. GCS motor subscore is 6.     Sensory: Sensation is intact. No sensory deficit.     Motor: Motor function is intact. No weakness.     Coordination: Coordination is intact. Coordination normal.     Gait: Gait is intact.     Deep Tendon Reflexes: Reflexes are normal and symmetric.  Psychiatric:        Attention and Perception: Attention normal.        Mood and Affect: Mood normal.        Speech: Speech normal.        Behavior: Behavior normal. Behavior is cooperative.        Thought Content: Thought content normal.        Cognition and Memory: Cognition and memory normal.        Judgment: Judgment normal.      Results for orders placed or performed in visit on 08/23/22  Hemoglobin A1c   Collection Time: 08/23/22  8:51 AM  Result Value Ref Range   Hgb A1c MFr Bld 5.5 4.8 - 5.6 %   Est. average glucose Bld gHb Est-mCnc 111 mg/dL  CBC with Differential/Platelet   Collection  Time: 08/23/22  8:51 AM  Result Value Ref Range   WBC 6.0 3.4 - 10.8 x10E3/uL   RBC 4.40 3.77 - 5.28 x10E6/uL   Hemoglobin 13.1 11.1 - 15.9 g/dL   Hematocrit 86.5 78.4 - 46.6 %   MCV 91 79 - 97 fL   MCH 29.8 26.6 - 33.0 pg   MCHC 32.6 31.5 - 35.7 g/dL   RDW 69.6 29.5 - 28.4 %   Platelets 195 150 - 450 x10E3/uL   Neutrophils 65 Not Estab. %   Lymphs 26 Not Estab. %   Monocytes 6 Not Estab. %   Eos 3 Not Estab. %   Basos 0 Not Estab. %   Neutrophils Absolute 3.9 1.4 - 7.0 x10E3/uL   Lymphocytes Absolute 1.6 0.7 - 3.1 x10E3/uL   Monocytes Absolute 0.3 0.1 - 0.9 x10E3/uL   EOS (ABSOLUTE) 0.2 0.0 - 0.4 x10E3/uL   Basophils Absolute 0.0 0.0 - 0.2 x10E3/uL   Immature Granulocytes 0 Not Estab. %   Immature Grans (Abs) 0.0 0.0 - 0.1 x10E3/uL  Comprehensive metabolic panel   Collection Time: 08/23/22  8:51 AM  Result Value Ref Range   Glucose 91 70 - 99 mg/dL   BUN 9 6 - 24 mg/dL   Creatinine, Ser 1.32 0.57 - 1.00 mg/dL   eGFR 440 >10 UV/OZD/6.64    BUN/Creatinine Ratio 15 9 - 23   Sodium 140 134 - 144 mmol/L   Potassium 4.3 3.5 - 5.2 mmol/L   Chloride 102 96 - 106 mmol/L   CO2 21 20 - 29 mmol/L   Calcium 8.9 8.7 - 10.2 mg/dL   Total Protein 6.7 6.0 - 8.5 g/dL   Albumin 4.5 3.9 - 4.9 g/dL   Globulin, Total 2.2 1.5 - 4.5 g/dL   Albumin/Globulin Ratio 2.0 1.2 - 2.2   Bilirubin Total 0.2 0.0 - 1.2 mg/dL   Alkaline Phosphatase 94 44 - 121 IU/L   AST 17 0 - 40 IU/L   ALT 14 0 - 32 IU/L  Lipid Cascade   Collection Time: 08/23/22  8:51 AM  Result Value Ref Range   Cholesterol, Total 185 100 - 199 mg/dL   HDL 53 >40 mg/dL   LDL/HDL Ratio 2.0 0.0 - 3.2 ratio   Total Non-HDL-Chol (LDL+VLDL) 132 (H) 0 - 129 mg/dL   Triglycerides 347 (H) 0 - 149 mg/dL   LDL Chol Calc (NIH) 425 (H) 0 - 99 mg/dL  Lipoprotein Analysis, by NMR   Collection Time: 08/23/22  8:51 AM  Result Value Ref Range   LDL Particle Number [id number] (H) <1,000 nmol/L   HDL Particle Number [id number] >=30.5 umol/L   Small LDL Particle Number 485 <=527 nmol/L   LDL Size 21.3 >20.5 nm   LP-IR Score 56 (H) <=45       Assessment & Plan:   Problem List Items Addressed This Visit     Depression   Chronic and stable. Some mood changes with perimenopause, but nothing consistent. Does not feel medication changes are needed at this time.       Relevant Medications   sertraline  (ZOLOFT ) 100 MG tablet   Other Relevant Orders   TSH   Encounter for annual physical exam - Primary   CPE completed today. Review of HM activities and recommendations discussed and provided on AVS. Anticipatory guidance, diet, and exercise recommendations provided. Medications, allergies, and hx reviewed and updated as necessary. Orders placed as listed below.  Plan: - Labs ordered. Will make changes as necessary based  on results.  - I will review these results and send recommendations via MyChart or a telephone call.  - F/U with CPE in 1 year or sooner for acute/chronic health needs as directed.         Relevant Medications   sertraline  (ZOLOFT ) 100 MG tablet   Other Relevant Orders   CBC with Differential/Platelet   CMP14+EGFR   Hemoglobin A1c   Lipid panel   TSH   VITAMIN D 25 Hydroxy (Vit-D Deficiency, Fractures)   ADD (attention deficit disorder) without hyperactivity   Chronic. Well controlled with PRN adderall. She is not in need of refills at this time.  Plan: - OK to send refills when needed over the next 12 months      Family history of colon cancer   Significant family history with maternal grandmother having colon cancer and mother with precancerous polyps at age 7. She is 43 years old and has not had a colonoscopy. Emphasized the importance of Ann Morris detection and treatment of colon cancer, especially given the family history. - Refer to gastroenterology for colonoscopy      Relevant Orders   Ambulatory referral to Gastroenterology   Mixed hyperlipidemia   Repeat labs today. Not currently managed with medication. Diet and exercise information provided.       Relevant Medications   tranexamic acid (LYSTEDA) 650 MG TABS tablet   sertraline  (ZOLOFT ) 100 MG tablet   Other Relevant Orders   CBC with Differential/Platelet   CMP14+EGFR   Hemoglobin A1c   Lipid panel   Class 2 obesity due to excess calories without serious comorbidity with body mass index (BMI) of 39.0 to 39.9 in adult   Discussion of diet and exercise for management. Labs pending to rule out underlying causes.       Relevant Medications   sertraline  (ZOLOFT ) 100 MG tablet   Other Relevant Orders   CBC with Differential/Platelet   CMP14+EGFR   Hemoglobin A1c   Lipid panel   Perimenopause   Experiencing mood changes, energy level fluctuations, and temperamental behavior, likely related to perimenopause. Discussed common perimenopausal symptoms and their impact on daily life. - Recommend calcium and vitamin D supplementation - Encourage weight-bearing exercise      Relevant Orders   TSH    VITAMIN D 25 Hydroxy (Vit-D Deficiency, Fractures)   Colicky RUQ abdominal pain   Intermittent right-sided abdominal pain for a few months, sometimes sharp and radiating to the back. Differential diagnosis includes gallbladder colic and kidney stones. Gallbladder colic is suspected due to the nature of the pain and its association with fatty meals. Stress may also be a contributing factor. - Order laboratory tests to check for inflammation and liver function - Refer to gastroenterology for further evaluation      Relevant Orders   CBC with Differential/Platelet   CMP14+EGFR   Hemoglobin A1c   Lipid panel   TSH   Excessive bleeding in premenopausal period   Heavy menstrual periods, currently on tranexamic acid with minimal effect. Symptoms may be related to perimenopausal changes. Discussed the limited effectiveness of current treatment and potential need for alternative management strategies. Recommend reach out to GYN for additional recommendations.       Other Visit Diagnoses       Encounter for long-term (current) use of medications       Relevant Orders   TSH   VITAMIN D 25 Hydroxy (Vit-D Deficiency, Fractures)         Follow up plan: Return in about  1 year (around 08/28/2024) for CPE.  NEXT PREVENTATIVE PHYSICAL DUE IN 1 YEAR.  PATIENT COUNSELING PROVIDED FOR ALL ADULT PATIENTS: A well balanced diet low in saturated fats, cholesterol, and moderation in carbohydrates.  This can be as simple as monitoring portion sizes and cutting back on sugary beverages such as soda and juice to start with.    Daily water consumption of at least 64 ounces.  Physical activity at least 180 minutes per week.  If just starting out, start 10 minutes a day and work your way up.   This can be as simple as taking the stairs instead of the elevator and walking 2-3 laps around the office  purposefully every day.   STD protection, partner selection, and regular testing if high risk.  Limited  consumption of alcoholic beverages if alcohol is consumed. For men, I recommend no more than 14 alcoholic beverages per week, spread out throughout the week (max 2 per day). Avoid "binge" drinking or consuming large quantities of alcohol in one setting.  Please let me know if you feel you may need help with reduction or quitting alcohol consumption.   Avoidance of nicotine, if used. Please let me know if you feel you may need help with reduction or quitting nicotine use.   Daily mental health attention. This can be in the form of 5 minute daily meditation, prayer, journaling, yoga, reflection, etc.  Purposeful attention to your emotions and mental state can significantly improve your overall wellbeing  and  Health.  Please know that I am here to help you with all of your health care goals and am happy to work with you to find a solution that works best for you.  The greatest advice I have received with any changes in life are to take it one step at a time, that even means if all you can focus on is the next 60 seconds, then do that and celebrate your victories.  With any changes in life, you will have set backs, and that is OK. The important thing to remember is, if you have a set back, it is not a failure, it is an opportunity to try again! Screening Testing Mammogram Every 1 -2 years based on history and risk factors Starting at age 4 Pap Smear Ages 21-39 every 3 years Ages 76-65 every 5 years with HPV testing More frequent testing may be required based on results and history Colon Cancer Screening Every 1-10 years based on test performed, risk factors, and history Starting at age 46 Bone Density Screening Every 2-10 years based on history Starting at age 74 for women Recommendations for men differ based on medication usage, history, and risk factors AAA Screening One time ultrasound Men 80-56 years old who have every smoked Lung Cancer Screening Low Dose Lung CT every 12  months Age 90-80 years with a 30 pack-year smoking history who still smoke or who have quit within the last 15 years   Screening Labs Routine  Labs: Complete Blood Count (CBC), Complete Metabolic Panel (CMP), Cholesterol (Lipid Panel) Every 6-12 months based on history and medications May be recommended more frequently based on current conditions or previous results Hemoglobin A1c Lab Every 3-12 months based on history and previous results Starting at age 73 or earlier with diagnosis of diabetes, high cholesterol, BMI >26, and/or risk factors Frequent monitoring for patients with diabetes to ensure blood sugar control Thyroid Panel (TSH) Every 6 months based on history, symptoms, and risk factors May be repeated  more often if on medication HIV One time testing for all patients 13 and older May be repeated more frequently for patients with increased risk factors or exposure Hepatitis C One time testing for all patients 24 and older May be repeated more frequently for patients with increased risk factors or exposure Gonorrhea, Chlamydia Every 12 months for all sexually active persons 13-24 years Additional monitoring may be recommended for those who are considered high risk or who have symptoms Every 12 months for any woman on birth control, regardless of sexual activity PSA Men 74-48 years old with risk factors Additional screening may be recommended from age 69-69 based on risk factors, symptoms, and history  Vaccine Recommendations Tetanus Booster All adults every 10 years Flu Vaccine All patients 6 months and older every year COVID Vaccine All patients 12 years and older Initial dosing with booster May recommend additional booster based on age and health history HPV Vaccine 2 doses all patients age 45-26 Dosing may be considered for patients over 26 Shingles Vaccine (Shingrix) 2 doses all adults 55 years and older Pneumonia (Pneumovax 23 ) All adults 65 years and  older May recommend earlier dosing based on health history One year apart from Prevnar 11 Pneumonia (Prevnar 67) All adults 65 years and older Dosed 1 year after Pneumovax 23  Pneumonia (Prevnar 20) One time alternative to the two dosing of 13 and 23 For all adults with initial dose of 23, 20 is recommended 1 year later For all adults with initial dose of 13, 23 is still recommended as second option 1 year later

## 2023-08-29 NOTE — Assessment & Plan Note (Signed)
 Significant family history with maternal grandmother having colon cancer and mother with precancerous polyps at age 44. She is 43 years old and has not had a colonoscopy. Emphasized the importance of Ann Morris detection and treatment of colon cancer, especially given the family history. - Refer to gastroenterology for colonoscopy

## 2023-08-29 NOTE — Patient Instructions (Addendum)
 Try ChatGPT to help with meal planning options that suit your tastes and family needs. You can be very specific to include a certain amount of carbohydrates and protein, etc.    WEIGHT LOSS PLANNING Your progress today shows:     08/29/2023    8:15 AM 08/23/2022    8:21 AM 08/12/2021    8:57 AM  Vitals with BMI  Height 5\' 1"  5' 1.5" 5\' 1"   Weight 215 lbs 10 oz 214 lbs 13 oz 207 lbs  BMI 40.76 39.93 39.13  Systolic 128 122 161  Diastolic 80 74 70  Pulse 64 84 64    For best management of weight, it is vital to balance intake versus output. This means the number of calories burned per day must be less than the calories you take in with food and drink.   I recommend trying to follow a diet with the following: Calories: No less than 1200 calories a day, no more than 1800 calories a day Carbohydrates: 150-180 grams of carbohydrates per day  Why: Gives your body enough "quick fuel" for cells to maintain normal function without sending them into starvation mode.  Protein: At least 90 grams of protein per day- 30 grams with each meal Why: Protein takes longer and uses more energy than carbohydrates to break down for fuel. The carbohydrates in your meals serves as quick energy sources and proteins help use some of that extra quick energy to break down to produce long term energy. This helps you not feel hungry as quickly and protein breakdown burns calories.  Water: Drink AT LEAST 64 ounces of water per day  Why: Water is essential to healthy metabolism. Water helps to fill the stomach and keep you fuller longer. Water is required for healthy digestion and filtering of waste in the body.  Fat: Limit fats in your diet- when choosing fats, choose foods with lower fats content such as lean meats (chicken, fish, [place]).  Why: Increased fat intake leads to storage "for later". Once you burn your carbohydrate energy, your body goes into fat and protein breakdown mode to help you loose weight.   Cholesterol: Fats and oils that are LIQUID at room temperature are best. Choose vegetable oils (olive oil, avocado oil, nuts). Avoid fats that are SOLID at room temperature (animal fats, processed meats). Healthy fats are often found in whole grains, beans, nuts, seeds, and berries.  Why: Elevated cholesterol levels lead to build up of cholesterol on the inside of your blood vessels. This will eventually cause the blood vessels to become hard and can lead to high blood pressure and damage to your organs. When the blood flow is reduced, but the pressure is high from cholesterol buildup, parts of the cholesterol can break off and form clots that can go to the brain or heart leading to a stroke or heart attack.  Fiber: Increase amount of SOLUBLE the fiber in your diet. This helps to fill you up, lowers cholesterol, and helps with digestion. Some foods high in soluble fiber are oats, peas, beans, apples, carrots, barley, and citrus fruits.   Why: Fiber fills you up, helps remove excess cholesterol, and aids in healthy digestion which are all very important in weight management.   I recommend the following as a minimum activity routine: Purposeful walk or other physical activity at least 20 minutes every single day. This means purposefully taking a walk, jog, bike, swim, treadmill, elliptical, dance, etc.  This activity should be ABOVE your normal daily  activities, such as walking at work. Goal exercise should be at least 150 minutes a week- work your way up to this.   Heart Rate: Your maximum exercise heart rate should be 220 - Your Age in Years. When exercising, get your heart rate up, but avoid going over the maximum targeted heart rate.  60-70% of your maximum heart rate is where you tend to burn the most fat. To find this number:  220 - Age In Years= Max HR  Max HR x 0.6 (or 0.7) = Fat Burning HR The Fat Burning HR is your goal heart rate while working out to burn the most fat.  NEVER exercise to  the point your feel lightheaded, weak, nauseated, dizzy. If you experience ANY of these symptoms- STOP exercise! Allow yourself to cool down and your heart rate to come down. Then restart slower next time.  If at ANY TIME you feel chest pain or chest pressure during exercise, STOP IMMEDIATELY and seek medical attention.

## 2023-08-30 LAB — CBC WITH DIFFERENTIAL/PLATELET
Basophils Absolute: 0 10*3/uL (ref 0.0–0.2)
Basos: 0 %
EOS (ABSOLUTE): 0.1 10*3/uL (ref 0.0–0.4)
Eos: 3 %
Hematocrit: 39.5 % (ref 34.0–46.6)
Hemoglobin: 13.5 g/dL (ref 11.1–15.9)
Immature Grans (Abs): 0 10*3/uL (ref 0.0–0.1)
Immature Granulocytes: 0 %
Lymphocytes Absolute: 1.6 10*3/uL (ref 0.7–3.1)
Lymphs: 34 %
MCH: 31.9 pg (ref 26.6–33.0)
MCHC: 34.2 g/dL (ref 31.5–35.7)
MCV: 93 fL (ref 79–97)
Monocytes Absolute: 0.3 10*3/uL (ref 0.1–0.9)
Monocytes: 6 %
Neutrophils Absolute: 2.7 10*3/uL (ref 1.4–7.0)
Neutrophils: 57 %
Platelets: 192 10*3/uL (ref 150–450)
RBC: 4.23 x10E6/uL (ref 3.77–5.28)
RDW: 12.1 % (ref 11.7–15.4)
WBC: 4.7 10*3/uL (ref 3.4–10.8)

## 2023-08-30 LAB — CMP14+EGFR
ALT: 16 IU/L (ref 0–32)
AST: 20 IU/L (ref 0–40)
Albumin: 4.4 g/dL (ref 3.9–4.9)
Alkaline Phosphatase: 94 IU/L (ref 44–121)
BUN/Creatinine Ratio: 19 (ref 9–23)
BUN: 12 mg/dL (ref 6–24)
Bilirubin Total: 0.3 mg/dL (ref 0.0–1.2)
CO2: 21 mmol/L (ref 20–29)
Calcium: 9.3 mg/dL (ref 8.7–10.2)
Chloride: 103 mmol/L (ref 96–106)
Creatinine, Ser: 0.63 mg/dL (ref 0.57–1.00)
Globulin, Total: 2.3 g/dL (ref 1.5–4.5)
Glucose: 88 mg/dL (ref 70–99)
Potassium: 4 mmol/L (ref 3.5–5.2)
Sodium: 139 mmol/L (ref 134–144)
Total Protein: 6.7 g/dL (ref 6.0–8.5)
eGFR: 113 mL/min/{1.73_m2} (ref >59)

## 2023-08-30 LAB — HEMOGLOBIN A1C
Est. average glucose Bld gHb Est-mCnc: 108 mg/dL
Hgb A1c MFr Bld: 5.4 % (ref 4.8–5.6)

## 2023-08-30 LAB — VITAMIN D 25 HYDROXY (VIT D DEFICIENCY, FRACTURES): Vit D, 25-Hydroxy: 27.7 ng/mL — ABNORMAL LOW (ref 30.0–100.0)

## 2023-08-30 LAB — LIPID PANEL
Chol/HDL Ratio: 3.9 ratio (ref 0.0–4.4)
Cholesterol, Total: 204 mg/dL — ABNORMAL HIGH (ref 100–199)
HDL: 52 mg/dL (ref >39)
LDL Chol Calc (NIH): 124 mg/dL — ABNORMAL HIGH (ref 0–99)
Triglycerides: 159 mg/dL — ABNORMAL HIGH (ref 0–149)
VLDL Cholesterol Cal: 28 mg/dL (ref 5–40)

## 2023-08-30 LAB — TSH: TSH: 1.58 u[IU]/mL (ref 0.450–4.500)

## 2023-08-31 ENCOUNTER — Ambulatory Visit: Payer: Self-pay | Admitting: Nurse Practitioner

## 2023-09-13 ENCOUNTER — Encounter: Payer: Self-pay | Admitting: Nurse Practitioner

## 2023-09-21 NOTE — Progress Notes (Signed)
 Ellouise Console, PA-C [address] [place], [place]  [address] Phone: [phone]   Primary Care Physician: Early, Camie BRAVO, NP  Primary Gastroenterologist:  Ellouise Console, PA-C / Glendia Holt, MD   Chief Complaint: Discuss colonoscopy, family history of colon cancer and precancerous colon polyps      HPI:   Ann Morris is a 43 y.o. female is here to discuss scheduling first screening colonoscopy.  Patient has never had a colonoscopy.  Family history is significant for maternal grandmother diagnosed with colon cancer in her 41s.  Patient's mother had pre-cancerous adenomatous polyps at age 4.  Patient's mother has had multiple colonoscopies and multiple precancerous polyps removed.  Patient's brother and sister had colonoscopies with no polyps.  GI symptoms: She has occasional episodes of diarrhea and lower abdominal cramping which is triggered by stress.  Patient has seen 1 or 2 episodes of mild bright red rectal bleeding in the past 6 months.  Denies weight loss or anemia.  08/29/23 Labs: Normal CBC (Hgb 13.5g).  Normal CMP & TSH.  Current Outpatient Medications  Medication Sig Dispense Refill   amphetamine -dextroamphetamine  (ADDERALL) 20 MG tablet Take 1 tablet (20 mg total) by mouth daily. 30 tablet 0   Na Sulfate-K Sulfate-Mg Sulfate concentrate (SUPREP) 17.5-3.13-1.6 GM/177ML SOLN Take 1 kit (354 mLs total) by mouth once for 1 dose. 354 mL 0   Psyllium (METAMUCIL FIBER SINGLES PO)      sertraline  (ZOLOFT ) 100 MG tablet TAKE 1 TABLET(100 MG) BY MOUTH DAILY 90 tablet 3   tranexamic acid (LYSTEDA) 650 MG TABS tablet Take 1,300 mg by mouth 3 (three) times daily.     No current facility-administered medications for this visit.    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    Physical Exam:  BP 120/78   Pulse 78   Ht 5' 1 (1.549 m)   Wt 214 lb (97.1 kg)   LMP 08/18/2023   BMI 40.43 kg/m  Patient's last menstrual period was 08/18/2023.  General: Well-nourished, well-developed in no acute distress.  Lungs: Clear to auscultation bilaterally. Non-labored. Heart: Regular rate and rhythm, no murmurs rubs or gallops.  Abdomen: Bowel sounds are normal; Abdomen is Soft; No hepatosplenomegaly, masses or hernias;  No Abdominal Tenderness; No guarding or rebound tenderness. Neuro: Alert and oriented x 3.  Grossly intact.  Psych: Alert and cooperative, normal mood and affect.   Imaging Studies: No results found.  Labs: CBC    Component Value Date/Time   WBC 4.7 08/29/2023 0933   WBC 8.9 05/03/2017 0534   RBC 4.23 08/29/2023 0933   RBC 3.70 (  L) 05/03/2017 0534   HGB 13.5 08/29/2023 0933   HCT 39.5 08/29/2023 0933   PLT 192 08/29/2023 0933   MCV 93 08/29/2023 0933   MCH 31.9 08/29/2023 0933   MCH 29.2 05/03/2017 0534   MCHC 34.2 08/29/2023 0933   MCHC 32.9 05/03/2017 0534   RDW 12.1 08/29/2023 0933   LYMPHSABS 1.6 08/29/2023 0933   MONOABS 340 02/01/2016 0922   EOSABS 0.1 08/29/2023 0933   BASOSABS 0.0 08/29/2023 0933    CMP     Component Value Date/Time   NA 139 08/29/2023 0933   K 4.0 08/29/2023 0933   CL 103 08/29/2023 0933   CO2 21 08/29/2023 0933   GLUCOSE 88 08/29/2023 0933   GLUCOSE 89  05/02/2017 1838   BUN 12 08/29/2023 0933   CREATININE 0.63 08/29/2023 0933   CREATININE 0.46 (L) 02/06/2017 1112   CALCIUM 9.3 08/29/2023 0933   PROT 6.7 08/29/2023 0933   ALBUMIN 4.4 08/29/2023 0933   AST 20 08/29/2023 0933   ALT 16 08/29/2023 0933   ALKPHOS 94 08/29/2023 0933   BILITOT 0.3 08/29/2023 0933   GFRNONAA 116 07/25/2019 1130   GFRAA 134 07/25/2019 1130    Assessment and Plan:   Ann Morris is a 43 y.o. y/o female presents for:  1.  Family history of colon cancer: Maternal grandmother age 87s  2.  Family history of colon polyps: Mother age 28s.  Patient's mother has had multiple colonoscopies and multiple precancerous polyps removed since age 64. - Scheduling Colonoscopy I discussed risks of colonoscopy with patient to include risk of bleeding, colon perforation, and risk of sedation.  Patient expressed understanding and agrees to proceed with colonoscopy.   3.  Intermittent lower abdominal cramping and diarrhea, suspect mild IBS. - Avoid trigger foods. - Decide about further treatment after colonoscopy if symptoms worsen or persist.  Ellouise Console, PA-C  Follow up as needed based on colonoscopy results and GI symptoms.

## 2023-09-22 ENCOUNTER — Ambulatory Visit (INDEPENDENT_AMBULATORY_CARE_PROVIDER_SITE_OTHER): Admitting: Physician Assistant

## 2023-09-22 ENCOUNTER — Encounter: Payer: Self-pay | Admitting: Physician Assistant

## 2023-09-22 VITALS — BP 120/78 | HR 78 | Ht 61.0 in | Wt 214.0 lb

## 2023-09-22 DIAGNOSIS — Z83719 Family history of colon polyps, unspecified: Secondary | ICD-10-CM

## 2023-09-22 DIAGNOSIS — Z800 Family history of malignant neoplasm of digestive organs: Secondary | ICD-10-CM | POA: Diagnosis not present

## 2023-09-22 DIAGNOSIS — Z01818 Encounter for other preprocedural examination: Secondary | ICD-10-CM

## 2023-09-22 MED ORDER — NA SULFATE-K SULFATE-MG SULF 17.5-3.13-1.6 GM/177ML PO SOLN: 1.0000 | Freq: Once | ORAL | 0 refills | Status: AC

## 2023-09-22 NOTE — Patient Instructions (Signed)
 You have been scheduled for a colonoscopy. Please follow written instructions given to you at your visit today.   If you use inhalers (even only as needed), please bring them with you on the day of your procedure.  DO NOT TAKE 7 DAYS PRIOR TO TEST- Trulicity (dulaglutide) Ozempic, Wegovy (semaglutide) Mounjaro (tirzepatide) Bydureon Bcise (exanatide extended release)  DO NOT TAKE 1 DAY PRIOR TO YOUR TEST Rybelsus (semaglutide) Adlyxin (lixisenatide) Victoza (liraglutide) Byetta (exanatide) ___________________________________________________________________________  Please follow up sooner if symptoms increase or worsen   Due to recent changes in healthcare laws, you may see the results of your imaging and laboratory studies on MyChart before your provider has had a chance to review them.  We understand that in some cases there may be results that are confusing or concerning to you. Not all laboratory results come back in the same time frame and the provider may be waiting for multiple results in order to interpret others.  Please give us  48 hours in order for your provider to thoroughly review all the results before contacting the office for clarification of your results.   Thank you for trusting me with your gastrointestinal care!   Ellouise Console, PA-C _______________________________________________________  If your blood pressure at your visit was 140/90 or greater, please contact your primary care physician to follow up on this.  _______________________________________________________  If you are age 15 or older, your body mass index should be between 23-30. Your Body mass index is 40.43 kg/m. If this is out of the aforementioned range listed, please consider follow up with your Primary Care Provider.  If you are age 39 or younger, your body mass index should be between 19-25. Your Body mass index is 40.43 kg/m. If this is out of the aformentioned range listed, please consider  follow up with your Primary Care Provider.   ________________________________________________________  The [facility] providers would like to encourage you to use MYCHART to communicate with providers for non-urgent requests or questions.  Due to long hold times on the telephone, sending your provider a message by [facility] may be a faster and more efficient way to get a response.  Please allow 48 business hours for a response.  Please remember that this is for non-urgent requests.  _______________________________________________________

## 2023-09-25 ENCOUNTER — Encounter: Payer: Self-pay | Admitting: Gastroenterology

## 2023-09-25 ENCOUNTER — Telehealth: Payer: Self-pay | Admitting: Physician Assistant

## 2023-09-25 NOTE — Telephone Encounter (Signed)
 Patient called and stated that she would like to Speak to our pre cert department regarding how her colonoscopy procedure will be coded in order to see how much she is having to pay out of pocket. Patient is requesting a call back. Please advise.

## 2023-09-26 NOTE — Progress Notes (Signed)
 Agree with the assessment and plan as outlined by Brigitte Canard, PA-C.

## 2023-10-19 ENCOUNTER — Encounter: Payer: Self-pay | Admitting: Nurse Practitioner

## 2023-10-20 ENCOUNTER — Other Ambulatory Visit: Payer: Self-pay | Admitting: Nurse Practitioner

## 2023-10-20 DIAGNOSIS — F988 Other specified behavioral and emotional disorders with onset usually occurring in childhood and adolescence: Secondary | ICD-10-CM

## 2023-10-20 MED ORDER — AMPHETAMINE-DEXTROAMPHETAMINE 20 MG PO TABS
20.0000 mg | ORAL_TABLET | Freq: Every day | ORAL | 0 refills | Status: AC
Start: 2023-11-17 — End: ?

## 2023-10-20 MED ORDER — AMPHETAMINE-DEXTROAMPHETAMINE 20 MG PO TABS: 20.0000 mg | ORAL_TABLET | Freq: Every day | ORAL | 0 refills | Status: AC

## 2023-12-08 ENCOUNTER — Encounter: Admitting: Gastroenterology

## 2023-12-21 ENCOUNTER — Other Ambulatory Visit: Payer: Self-pay | Admitting: Medical Genetics

## 2023-12-22 ENCOUNTER — Encounter: Payer: Self-pay | Admitting: Gastroenterology

## 2023-12-22 ENCOUNTER — Ambulatory Visit: Admitting: Gastroenterology

## 2023-12-22 VITALS — BP 133/85 | HR 60 | Temp 97.4°F | Resp 24 | Ht 61.0 in | Wt 211.0 lb

## 2023-12-22 DIAGNOSIS — K635 Polyp of colon: Secondary | ICD-10-CM

## 2023-12-22 DIAGNOSIS — K5730 Diverticulosis of large intestine without perforation or abscess without bleeding: Secondary | ICD-10-CM

## 2023-12-22 DIAGNOSIS — Z83719 Family history of colon polyps, unspecified: Secondary | ICD-10-CM | POA: Diagnosis not present

## 2023-12-22 DIAGNOSIS — Z1211 Encounter for screening for malignant neoplasm of colon: Secondary | ICD-10-CM

## 2023-12-22 DIAGNOSIS — D120 Benign neoplasm of cecum: Secondary | ICD-10-CM | POA: Diagnosis not present

## 2023-12-22 DIAGNOSIS — Z800 Family history of malignant neoplasm of digestive organs: Secondary | ICD-10-CM

## 2023-12-22 DIAGNOSIS — D125 Benign neoplasm of sigmoid colon: Secondary | ICD-10-CM

## 2023-12-22 DIAGNOSIS — D123 Benign neoplasm of transverse colon: Secondary | ICD-10-CM

## 2023-12-22 MED ORDER — SODIUM CHLORIDE 0.9 % IV SOLN: 500.0000 mL | INTRAVENOUS | Status: AC

## 2023-12-22 NOTE — Progress Notes (Signed)
 Called to room to assist during endoscopic procedure.  Patient ID and intended procedure confirmed with present staff. Received instructions for my participation in the procedure from the performing physician.

## 2023-12-22 NOTE — Progress Notes (Signed)
 [facility] History and Physical   Primary Care Physician:  Early, Camie BRAVO, NP   Reason for Procedure:   Colon cancer screening/high risk  Plan:    Colonoscopy     HPI: Ann Morris is a 43 y.o. female undergoing initial high risk screening colonoscopy.  Her mother has had multiple polyps removed since her early 84s.  Her maternal grandmother had colon cancer in her 4s.   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 Authorizing Provider  amphetamine -dextroamphetamine  (ADDERALL) 20 MG tablet Take 1 tablet (20 mg total) by mouth daily. 10/20/23  Yes Early, Sara E, NP  sertraline  (ZOLOFT ) 100 MG tablet TAKE 1 TABLET(100 MG) BY MOUTH DAILY 08/29/23  Yes Early, Sara E, NP  amphetamine -dextroamphetamine  (ADDERALL) 20 MG tablet Take 1 tablet (20 mg total) by mouth daily. 11/17/23   Early, Sara E, NP  amphetamine -dextroamphetamine  (ADDERALL) 20 MG tablet Take 1 tablet (20 mg total) by mouth daily. 12/15/23   Early, Sara E, NP  Psyllium (METAMUCIL FIBER SINGLES PO)  03/27/23   [provider]    Current Outpatient Medications  Medication Sig Dispense Refill   amphetamine -dextroamphetamine   (ADDERALL) 20 MG tablet Take 1 tablet (20 mg total) by mouth daily. 30 tablet 0   sertraline  (ZOLOFT ) 100 MG tablet TAKE 1 TABLET(100 MG) BY MOUTH DAILY 90 tablet 3   amphetamine -dextroamphetamine  (ADDERALL) 20 MG tablet Take 1 tablet (20 mg total) by mouth daily. 30 tablet 0   amphetamine -dextroamphetamine  (ADDERALL) 20 MG tablet Take 1 tablet (20 mg total) by mouth daily. 30 tablet 0   Psyllium (METAMUCIL FIBER SINGLES PO)      Current Facility-Administered Medications  Medication Dose Route Frequency Provider Last Rate Last Admin   0.9 %  sodium chloride  infusion  500 mL Intravenous Continuous Stacia Glendia BRAVO, MD        Allergies as of 12/22/2023   (No Known Allergies)    Family History  Problem Relation Age of Onset   Colon polyps Mother    Hypertension Mother    Diabetes Father    Asthma Father    Thyroid disease Sister    Hyperlipidemia Brother    Colon cancer Maternal Grandmother        dx in 44s   Heart disease Maternal Grandfather    Heart attack Paternal Grandfather    Diabetes Paternal Grandfather    Heart disease Paternal Grandfather    Esophageal cancer Neg Hx    Pancreatic cancer Neg Hx    Stomach cancer Neg Hx    Breast cancer Neg Hx    CVA Neg Hx     Social  History   Socioeconomic History   Marital status: Married    Spouse name: Not on file   Number of children: Not on file   Years of education: Not on file   Highest education level: Bachelor's degree (e.g., BA, AB, BS)  Occupational History   Not on file  Tobacco Use   Smoking status: Former    Current packs/day: 0.00    Types: Cigarettes    Quit date: 12/13/2014    Years since quitting: 9.0   Smokeless tobacco: Never  Vaping Use   Vaping status: Never Used  Substance and Sexual Activity   Alcohol use: Yes    Alcohol/week: 2.0 standard drinks of alcohol    Types: 2 Glasses of wine per week    Comment: 2-3  drinks per day   Drug use: No   Sexual activity: Yes  Other Topics Concern    Not on file  Social History Narrative   Not on file   Social Drivers of Health   Financial Resource Strain: Low Risk  (08/28/2023)   Overall Financial Resource Strain (CARDIA)    Difficulty of Paying Living Expenses: Not very hard  Food Insecurity: No Food Insecurity (08/28/2023)   Hunger Vital Sign    Worried About Running Out of Food in the Last Year: Never true    Ran Out of Food in the Last Year: Never true  Transportation Needs: Unknown (08/28/2023)   PRAPARE - Administrator, Civil Service (Medical): No    Lack of Transportation (Non-Medical): Not on file  Physical Activity: Insufficiently Active (08/28/2023)   Exercise Vital Sign    Days of Exercise per Week: 2 days    Minutes of Exercise per Session: 20 min  Stress: Stress Concern Present (08/28/2023)   [employer] of Occupational Health - Occupational Stress Questionnaire    Feeling of Stress : To some extent  Social Connections: Socially Integrated (08/28/2023)   Social Connection and Isolation Panel    Frequency of Communication with Friends and Family: More than three times a week    Frequency of Social Gatherings with Friends and Family: Once a week    Attends Religious Services: More than 4 times per year    Active Member of [employer] or Organizations: Yes    Attends Banker Meetings: More than 4 times per year    Marital Status: Married  Catering manager Violence: Unknown (10/23/2022)   Received from [facility]   HITS    Physically Hurt: Not on file    Insult or Talk Down To: Not on file    Threaten Physical Harm: Not on file    Scream or Curse: Not on file    Review of Systems:  All other review of systems negative except as mentioned in the HPI.  Physical Exam: Vital signs BP 117/71   Pulse 68   Temp (!) 97.4 F (36.3 C)   Ht 5' 1 (1.549 m)   Wt 211 lb (95.7 kg)   SpO2 99%   BMI 39.87 kg/m   General:   Alert,  Well-developed, well-nourished, pleasant and cooperative in  NAD Airway:  Mallampati 1 Lungs:  Clear throughout to auscultation.   Heart:  Regular rate and rhythm; no murmurs, clicks, rubs,  or gallops. Abdomen:  Soft, nontender and nondistended. Normal bowel sounds.   Neuro/Psych:  Normal mood and affect. A and O x 3   Ascencion Stegner E. Stacia, MD [facility]

## 2023-12-22 NOTE — Patient Instructions (Addendum)
 Resume previous diet.                           - Continue present medications.                           - Await pathology results.                           - Repeat colonoscopy (date not yet determined) for                            surveillance based on pathology results. Handout on polyps and diverticulosis given.     YOU HAD AN ENDOSCOPIC PROCEDURE TODAY AT THE  ENDOSCOPY CENTER:   Refer to the procedure report that was given to you for any specific questions about what was found during the examination.  If the procedure report does not answer your questions, please call your gastroenterologist to clarify.  If you requested that your care partner not be given the details of your procedure findings, then the procedure report has been included in a sealed envelope for you to review at your convenience later.  YOU SHOULD EXPECT: Some feelings of bloating in the abdomen. Passage of more gas than usual.  Walking can help get rid of the air that was put into your GI tract during the procedure and reduce the bloating. If you had a lower endoscopy (such as a colonoscopy or flexible sigmoidoscopy) you may notice spotting of blood in your stool or on the toilet paper. If you underwent a bowel prep for your procedure, you may not have a normal bowel movement for a few days.  Please Note:  You might notice some irritation and congestion in your nose or some drainage.  This is from the oxygen used during your procedure.  There is no need for concern and it should clear up in a day or so.  SYMPTOMS TO REPORT IMMEDIATELY:  Following lower endoscopy (colonoscopy or flexible sigmoidoscopy):  Excessive amounts of blood in the stool  Significant tenderness or worsening of abdominal pains  Swelling of the abdomen that is new, acute  Fever of 100F or higher   For urgent or emergent issues, a gastroenterologist can be reached at any hour by calling [phone]. Do not use MyChart messaging for  urgent concerns.    DIET:  We do recommend a small meal at first, but then you may proceed to your regular diet.  Drink plenty of fluids but you should avoid alcoholic beverages for 24 hours.  ACTIVITY:  You should plan to take it easy for the rest of today and you should NOT DRIVE or use heavy machinery until tomorrow (because of the sedation medicines used during the test).    FOLLOW UP: Our staff will call the number listed on your records the next business day following your procedure.  We will call around 7:15- 8:00 am to check on you and address any questions or concerns that you may have regarding the information given to you following your procedure. If we do not reach you, we will leave a message.     If any biopsies were taken you will be contacted by phone or by letter within the next 1-3 weeks.  Please call us  at [phone] if you have not heard about  the biopsies in 3 weeks.    SIGNATURES/CONFIDENTIALITY: You and/or your care partner have signed paperwork which will be entered into your electronic medical record.  These signatures attest to the fact that that the information above on your After Visit Summary has been reviewed and is understood.  Full responsibility of the confidentiality of this discharge information lies with you and/or your care-partner.

## 2023-12-22 NOTE — Op Note (Addendum)
 [facility] Patient Name: Ann Morris Procedure Date: 12/22/2023 9:18 AM MRN: [id number] Endoscopist: Glendia E. Stacia , MD, [id number] Age: 43 Referring MD:  Date of Birth: January 01, 1981 Gender: Female Account #: [id number] Procedure:                Colonoscopy Indications:              Colon cancer screening in patient at increased                            risk: Family history of 1st-degree relative with                            colon polyps before age 5 years Medicines:                Monitored Anesthesia Care Procedure:                Pre-Anesthesia Assessment:                           - Prior to the procedure, a History and Physical                            was performed, and patient medications and                            allergies were reviewed. The patient's tolerance of                            previous anesthesia was also reviewed. The risks                            and benefits of the procedure and the sedation                            options and risks were discussed with the patient.                            All questions were answered, and informed consent                            was obtained. Prior Anticoagulants: The patient has                            taken no anticoagulant or antiplatelet agents. ASA                            Grade Assessment: II - A patient with mild systemic                            disease. After reviewing the risks and benefits,                            the patient was deemed in satisfactory condition to  undergo the procedure.                           After obtaining informed consent, the colonoscope                            was passed under direct vision. Throughout the                            procedure, the patient's blood pressure, pulse, and                            oxygen saturations were monitored continuously. The                            Olympus Scope  [id number] was introduced through the                            anus and advanced to the the terminal ileum, with                            identification of the appendiceal orifice and IC                            valve. The colonoscopy was performed without                            difficulty. The patient tolerated the procedure                            well. The quality of the bowel preparation was                            good. The terminal ileum, ileocecal valve,                            appendiceal orifice, and rectum were photographed.                            The bowel preparation used was SUPREP via split                            dose instruction. Scope In: 9:40:38 AM Scope Out: 9:59:20 AM Scope Withdrawal Time: 0 hours 15 minutes 18 seconds  Total Procedure Duration: 0 hours 18 minutes 42 seconds  Findings:                 The perianal and digital rectal examinations were                            normal. Pertinent negatives include normal                            sphincter tone and no palpable rectal lesions.  A 3 mm polyp was found in the cecum. The polyp was                            sessile. The polyp was removed with a cold snare.                            Resection and retrieval were complete. Estimated                            blood loss was minimal.                           A 4 mm polyp was found in the transverse colon. The                            polyp was sessile. The polyp was removed with a                            cold snare. After the snare resection, it appeared                            that there was some residual polypoid tissue left.                            This was removed with forceps, as repeated attempts                            with snare resection were unsuccessful (snare                            repeatedly slid over the polypectomy site).                            Resection and retrieval  were complete. Estimated                            blood loss was minimal.                           An 8 mm polyp was found in the sigmoid colon                            proximal sigmoid colon. The polyp was pedunculated.                            The polyp was removed with a cold snare. Resection                            and retrieval were complete. Estimated blood loss                            was minimal.  A few small-mouthed diverticula were found in the                            transverse colon. There was no evidence of                            diverticular bleeding.                           The exam was otherwise normal throughout the                            examined colon.                           The terminal ileum appeared normal.                           The retroflexed view of the distal rectum and anal                            verge was normal and showed no anal or rectal                            abnormalities. Complications:            No immediate complications. Estimated Blood Loss:     Estimated blood loss was minimal. Impression:               - One 3 mm polyp in the cecum, removed with a cold                            snare. Resected and retrieved.                           - One 4 mm polyp in the transverse colon, removed                            with a cold snare and forceps. Resected and                            retrieved.                           - One 8 mm polyp in the sigmoid colon in the                            proximal sigmoid colon, removed with a cold snare.                            Resected and retrieved.                           - Mild diverticulosis in the transverse colon.  There was no evidence of diverticular bleeding.                           - The examined portion of the ileum was normal.                           - The distal rectum and anal verge are normal on                             retroflexion view. Recommendation:           - Patient has a contact number available for                            emergencies. The signs and symptoms of potential                            delayed complications were discussed with the                            patient. Return to normal activities tomorrow.                            Written discharge instructions were provided to the                            patient.                           - Resume previous diet.                           - Continue present medications.                           - Await pathology results.                           - Repeat colonoscopy (date not yet determined) for                            surveillance based on pathology results. Hatsue Sime E. Stacia, MD 12/22/2023 10:06:46 AM This report has been signed electronically.

## 2023-12-22 NOTE — Progress Notes (Signed)
 Sedate, gd SR, tolerated procedure well, VSS, report to RN

## 2023-12-25 ENCOUNTER — Telehealth: Payer: Self-pay

## 2023-12-25 NOTE — Telephone Encounter (Signed)
 No answer on follow up call - voice mail message left

## 2023-12-27 LAB — SURGICAL PATHOLOGY

## 2023-12-30 ENCOUNTER — Ambulatory Visit: Payer: Self-pay | Admitting: Gastroenterology

## 2023-12-30 NOTE — Progress Notes (Signed)
 Ann Morris,   The polyps that I removed during your recent procedure were completely benign but were proven to be pre-cancerous polyps that MAY have grown into cancers if they had not been removed.  Studies shows that at least 20% of women over age 44 and 30% of men over age 42 have pre-cancerous polyps.  Based on current nationally recognized surveillance guidelines, I recommend that you have a repeat colonoscopy in 5 years.   If you develop any new rectal bleeding, abdominal pain or significant bowel habit changes, please contact me before then.

## 2024-02-07 ENCOUNTER — Other Ambulatory Visit

## 2024-02-07 DIAGNOSIS — Z006 Encounter for examination for normal comparison and control in clinical research program: Secondary | ICD-10-CM

## 2024-02-14 ENCOUNTER — Encounter: Payer: Self-pay | Admitting: Nurse Practitioner

## 2024-02-14 ENCOUNTER — Other Ambulatory Visit: Payer: Self-pay | Admitting: Nurse Practitioner

## 2024-02-14 DIAGNOSIS — Z832 Family history of diseases of the blood and blood-forming organs and certain disorders involving the immune mechanism: Secondary | ICD-10-CM

## 2024-02-19 LAB — GENECONNECT MOLECULAR SCREEN: Genetic Analysis Overall Interpretation: NEGATIVE

## 2024-09-03 ENCOUNTER — Encounter: Payer: Self-pay | Admitting: Nurse Practitioner
